# Patient Record
Sex: Female | Born: 1985 | Race: Black or African American | Hispanic: No | Marital: Married | Smoking: Never smoker
Health system: Southern US, Community
[De-identification: ages and names within clinical notes are randomized; demographics above are authoritative.]

## PROBLEM LIST (undated history)

## (undated) DIAGNOSIS — R7303 Prediabetes: Secondary | ICD-10-CM

---

## 2009-06-04 ENCOUNTER — Emergency Department (HOSPITAL_BASED_OUTPATIENT_CLINIC_OR_DEPARTMENT_OTHER): Admission: EM | Admit: 2009-06-04 | Discharge: 2009-06-04 | Payer: Self-pay | Admitting: Emergency Medicine

## 2009-06-22 ENCOUNTER — Emergency Department (HOSPITAL_BASED_OUTPATIENT_CLINIC_OR_DEPARTMENT_OTHER): Admission: EM | Admit: 2009-06-22 | Discharge: 2009-06-22 | Payer: Self-pay | Admitting: Emergency Medicine

## 2009-06-22 ENCOUNTER — Ambulatory Visit: Payer: Self-pay | Admitting: Diagnostic Radiology

## 2010-03-25 ENCOUNTER — Ambulatory Visit: Payer: Self-pay | Admitting: Interventional Radiology

## 2010-03-25 ENCOUNTER — Emergency Department (HOSPITAL_BASED_OUTPATIENT_CLINIC_OR_DEPARTMENT_OTHER): Admission: EM | Admit: 2010-03-25 | Discharge: 2010-03-25 | Payer: Self-pay | Admitting: Emergency Medicine

## 2010-10-11 IMAGING — CR DG CHEST 2V
2 series · 2 of 2 positions shown · non-contrast
Comparison: None

CLINICAL DATA: Chest pain.

CHEST - 2 VIEW

[w chest pa]
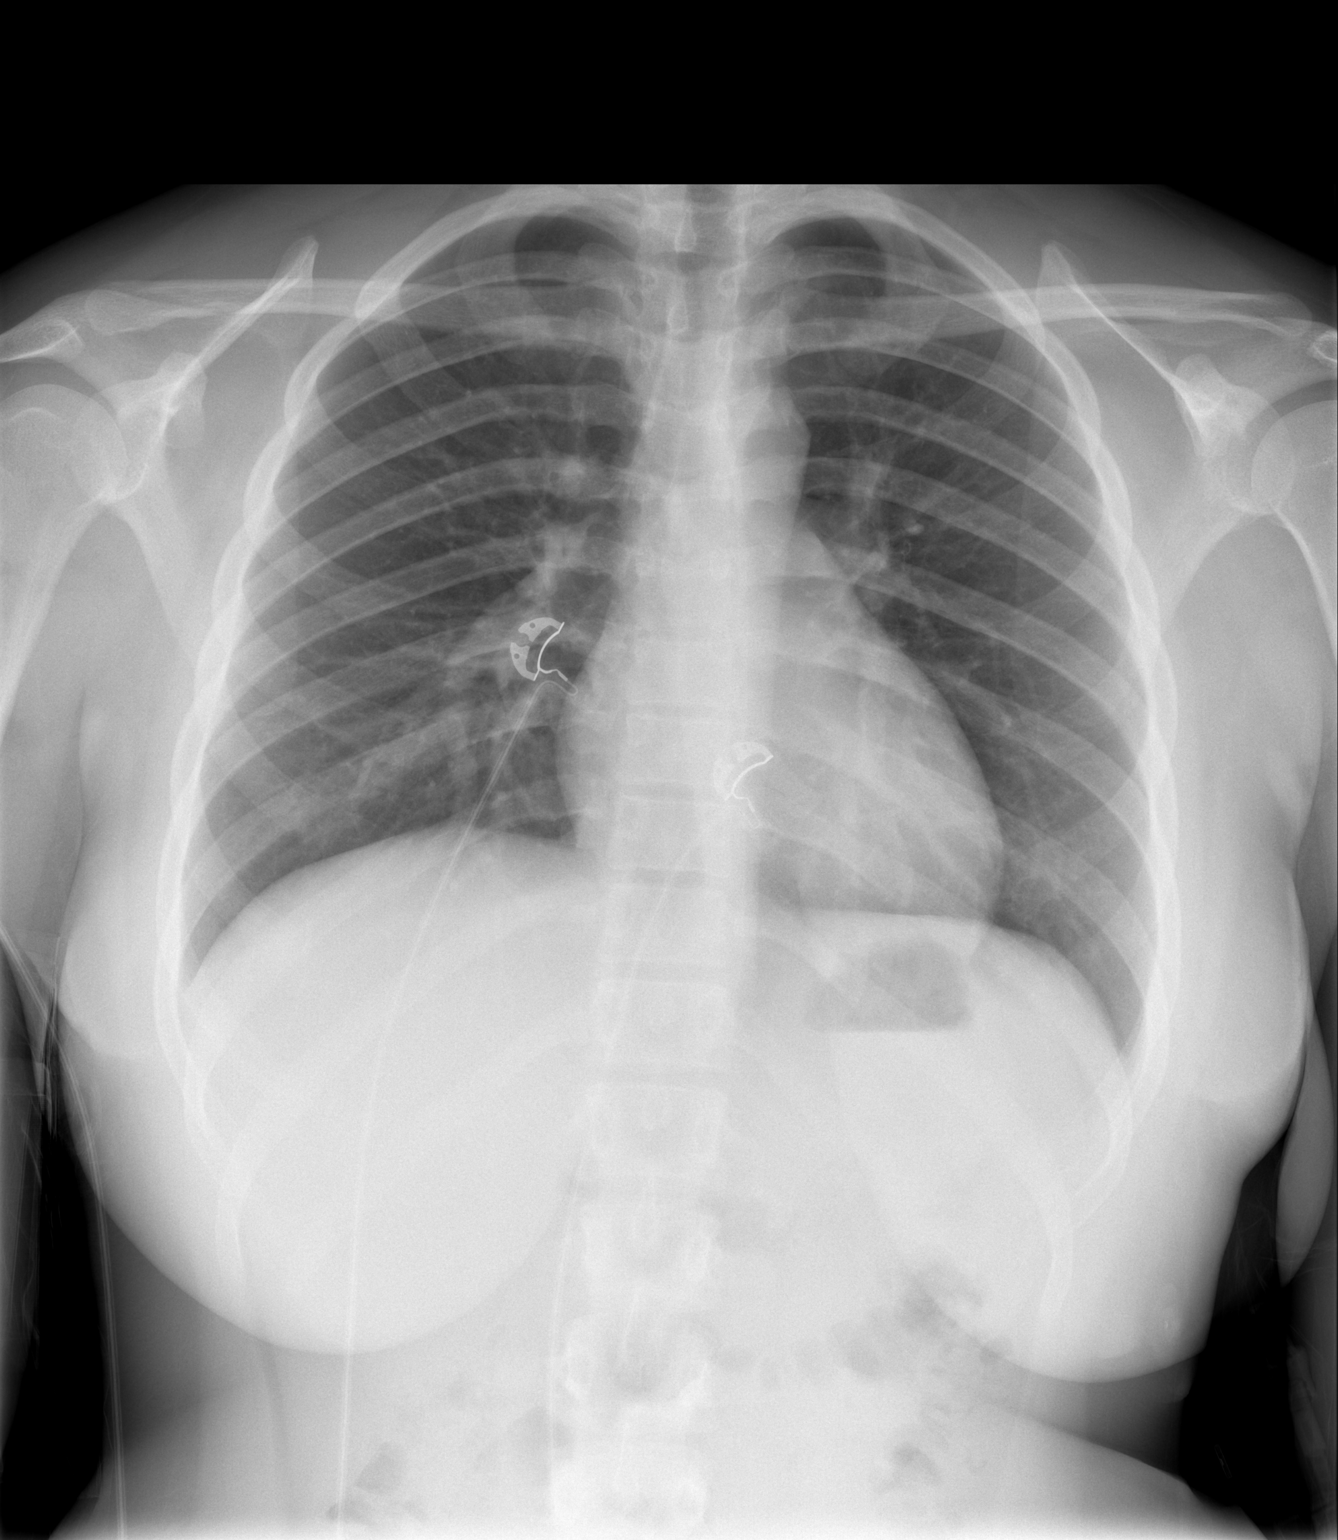

[w chest lat]
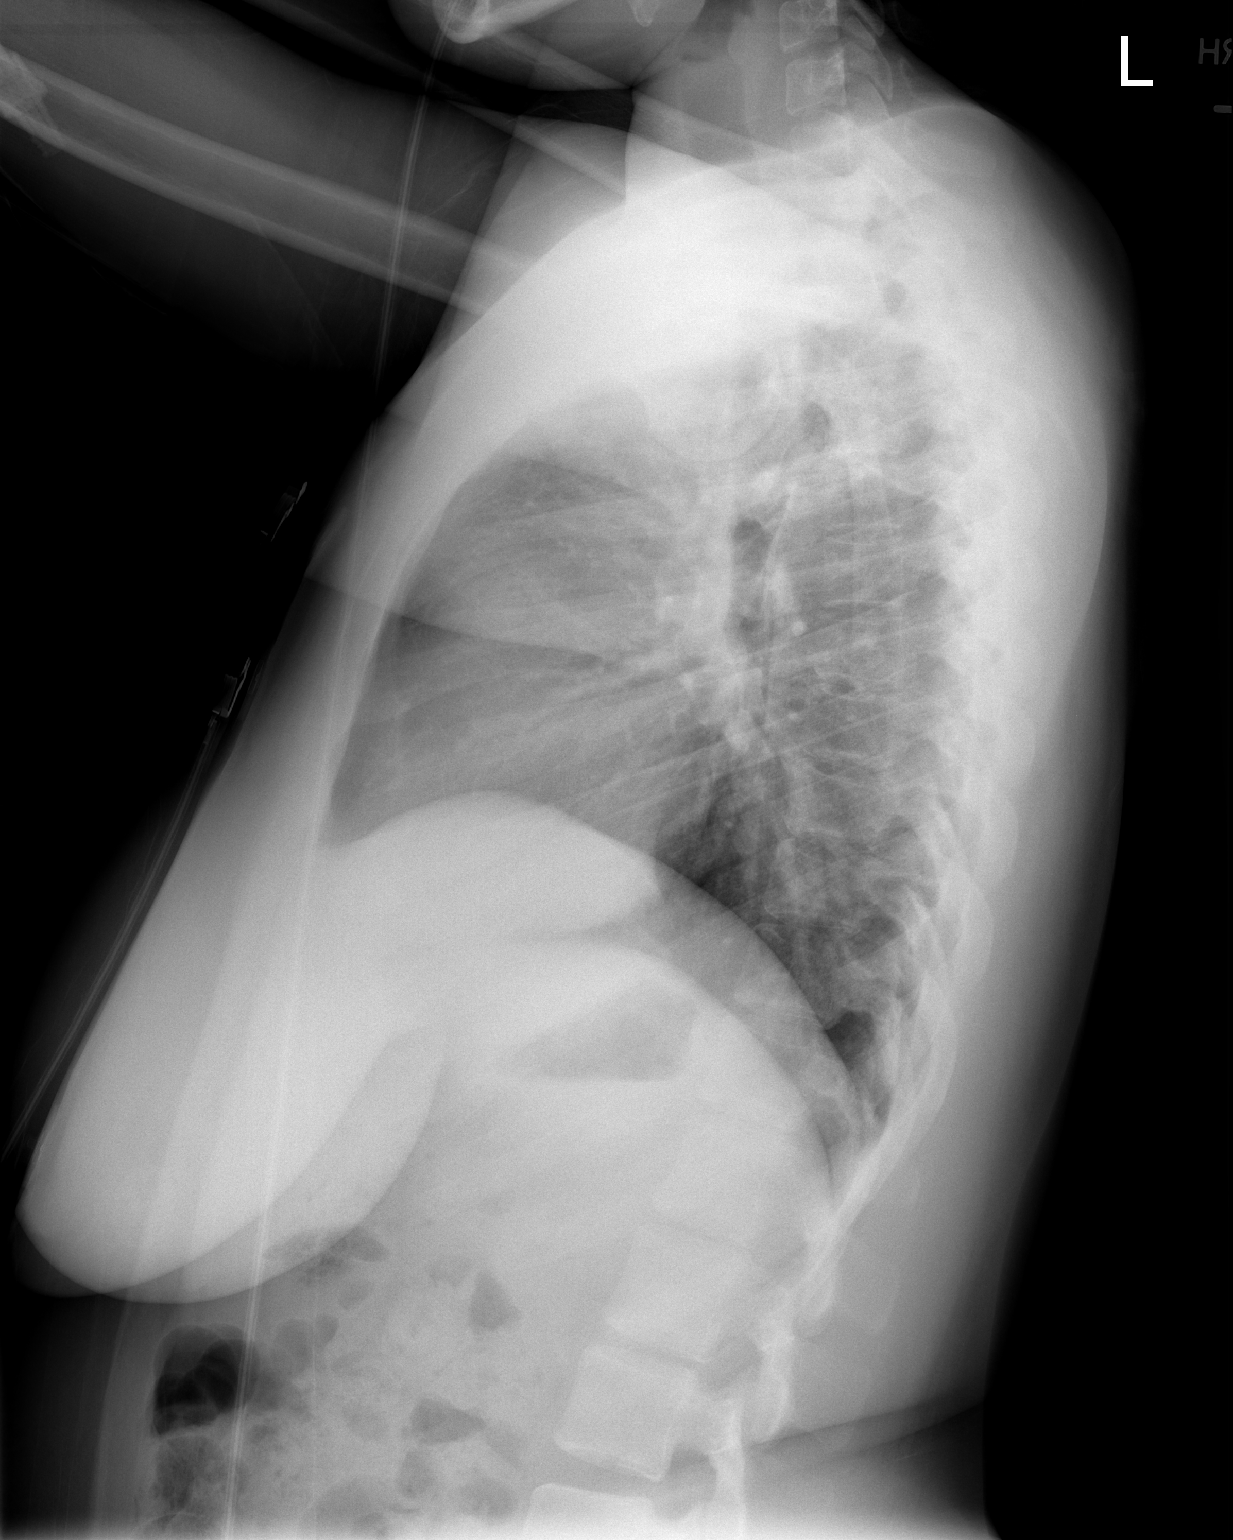

[2 of 2 positions shown; findings below may reference images not displayed]

FINDINGS: The heart size and mediastinal contours are within normal
limits.  Both lungs are clear.  The visualized skeletal structures
are unremarkable.
IMPRESSION: No active disease.

## 2011-09-19 ENCOUNTER — Encounter (HOSPITAL_BASED_OUTPATIENT_CLINIC_OR_DEPARTMENT_OTHER): Payer: Self-pay | Admitting: *Deleted

## 2011-09-19 ENCOUNTER — Emergency Department (INDEPENDENT_AMBULATORY_CARE_PROVIDER_SITE_OTHER): Payer: Self-pay

## 2011-09-19 ENCOUNTER — Other Ambulatory Visit: Payer: Self-pay

## 2011-09-19 ENCOUNTER — Emergency Department (HOSPITAL_BASED_OUTPATIENT_CLINIC_OR_DEPARTMENT_OTHER)
Admission: EM | Admit: 2011-09-19 | Discharge: 2011-09-19 | Disposition: A | Discharge status: Home / Self Care | Payer: Self-pay | Attending: Emergency Medicine | Admitting: Emergency Medicine

## 2011-09-19 DIAGNOSIS — R079 Chest pain, unspecified: Secondary | ICD-10-CM | POA: Insufficient documentation

## 2011-09-19 DIAGNOSIS — R630 Anorexia: Secondary | ICD-10-CM

## 2011-09-19 DIAGNOSIS — R5381 Other malaise: Secondary | ICD-10-CM

## 2011-09-19 DIAGNOSIS — R0602 Shortness of breath: Secondary | ICD-10-CM

## 2011-09-19 DIAGNOSIS — E669 Obesity, unspecified: Secondary | ICD-10-CM | POA: Insufficient documentation

## 2011-09-19 DIAGNOSIS — R05 Cough: Secondary | ICD-10-CM | POA: Insufficient documentation

## 2011-09-19 DIAGNOSIS — R059 Cough, unspecified: Secondary | ICD-10-CM | POA: Insufficient documentation

## 2011-09-19 DIAGNOSIS — R000 Tachycardia, unspecified: Secondary | ICD-10-CM | POA: Insufficient documentation

## 2011-09-19 MED ORDER — GUAIFENESIN-CODEINE 100-10 MG/5ML PO SYRP: 5.0000 mL | ORAL_SOLUTION | Freq: Three times a day (TID) | ORAL | Status: AC | PRN

## 2011-09-19 MED ORDER — IBUPROFEN 400 MG PO TABS
400.0000 mg | ORAL_TABLET | Freq: Once | ORAL | Status: AC
  Administered 2011-09-19: 400 mg via ORAL
  Filled 2011-09-19: qty 1

## 2011-09-19 NOTE — ED Provider Notes (Signed)
History    26 year old female with cough and chest pain. Noticed when woke up around 6:00 yesterday morning. Pain is in the substernal area, worse with coughing and relatively constant. Describes pain as achy in character. No appreciable exacerbating or relieving factors. Cough is nonproductive. Reports temperature at 100.6. Denies shortness of breath. No unusual leg pain or swelling. Denies history of blood clot. Denies exogenous estrogen use. No recent immobilization. No history of cancer or Recent surgery. Also mild headache. No other complaints. No significant past medical history. Nonsmoker. CSN: [id number]  Arrival date & time 09/19/11  4098   First MD Initiated Contact with Patient 09/19/11 1016      Chief Complaint  Patient presents with  . Chest Pain    (Consider location/radiation/quality/duration/timing/severity/associated sxs/prior treatment) HPI  History reviewed. No pertinent past medical history.  History reviewed. No pertinent past surgical history.  No family history on file.  History  Substance Use Topics  . Smoking status: Never Smoker   . Smokeless tobacco: Never Used  . Alcohol Use: No    OB History    Grav Para Term Preterm Abortions TAB SAB Ect Mult Living                  Review of Systems   Review of symptoms negative unless otherwise noted in HPI.   Allergies  Review of patient's allergies indicates no known allergies.  Home Medications  No current outpatient prescriptions on file.  BP 115/66  Pulse 101  Temp(Src) 98.4 F (36.9 C) (Oral)  Resp 18  Ht 5\' 2"  (1.575 m)  Wt 190 lb (86.183 kg)  BMI 34.75 kg/m2  SpO2 100%  Physical Exam  Nursing note and vitals reviewed. Constitutional: No distress.       Sitting up in bed. No acute distress. Obese.  HENT:  Head: Normocephalic and atraumatic.  Right Ear: External ear normal.  Left Ear: External ear normal.  Eyes: Conjunctivae are normal. Pupils are equal, round, and reactive to  light. Right eye exhibits no discharge. Left eye exhibits no discharge.  Neck: Normal range of motion. Neck supple.  Cardiovascular: Regular rhythm and normal heart sounds.  Exam reveals no gallop and no friction rub.   No murmur heard.      Mild tachycardia. No friction rub.  Pulmonary/Chest: Effort normal and breath sounds normal. No respiratory distress. She exhibits tenderness.       Lungs are clear. Symmetric chest rise equal breath sounds. Chest pain is reproducible with palpation in the mid to upper sternal area.  Abdominal: Soft. She exhibits no distension. There is no tenderness.  Musculoskeletal: She exhibits no edema and no tenderness.       Lower extremities symmetric. No edema. No calf tenderness. Negative Homans sign.  Lymphadenopathy:    She has no cervical adenopathy.  Neurological: She is alert.  Skin: Skin is warm and dry. She is not diaphoretic.  Psychiatric: She has a normal mood and affect. Her behavior is normal. Thought content normal.    ED Course  Procedures (including critical care time)  Labs Reviewed - No data to display Dg Chest 2 View  09/19/2011  *RADIOLOGY REPORT*  Clinical Data: Shortness of breath, chest pain, decreased energy, no appetite, fever, chills  CHEST - 2 VIEW  Comparison: 03/25/2010  Findings: Normal heart size, mediastinal contours, and pulmonary vascularity. Chronic peribronchial thickening. No definite infiltrate, pleural effusion, or pneumothorax. No acute osseous findings.  IMPRESSION: Minimal chronic bronchitic changes.  Original Report Authenticated  By: Lollie Marrow, M.D.   EKG:  Rhythm: Normal sinus rhythm Rate: 94 Axis: Normal Intervals: Normal ST segments: Nonspecific ST changes anteriorly and laterally.   1. Cough   2. Chest pain       MDM  26 year old female with chest pain and cough. Doubt ACS. Pain is atypical and reproducible on exam. Patient has no significant risk factors. Mild tachycardia noted, but low clinical  suspicion for pulmonary embolism. Consider myopericarditis but doubt. Chest x-ray with no focal consolidation and no pneumothorax. Suspect viral URI. Plan symptomatic treatment. Return precautions discussed. Outpatient followup as needed.       Raeford Razor, MD 09/19/11 1106

## 2011-09-19 NOTE — ED Notes (Signed)
Patient states she woke up with chest pain yesterday morning.  Describes pain as aching pressure.  Pain is associated with a cough and a headache.

## 2011-09-19 NOTE — ED Notes (Signed)
Patient states she has had a headache for several day, temperature of 100.6 yesterday and fatigue.

## 2012-01-20 ENCOUNTER — Emergency Department (HOSPITAL_BASED_OUTPATIENT_CLINIC_OR_DEPARTMENT_OTHER): Payer: Self-pay

## 2012-01-20 ENCOUNTER — Emergency Department (HOSPITAL_BASED_OUTPATIENT_CLINIC_OR_DEPARTMENT_OTHER)
Admission: EM | Admit: 2012-01-20 | Discharge: 2012-01-20 | Disposition: A | Discharge status: Home / Self Care | Payer: Self-pay | Attending: Emergency Medicine | Admitting: Emergency Medicine

## 2012-01-20 ENCOUNTER — Encounter (HOSPITAL_BASED_OUTPATIENT_CLINIC_OR_DEPARTMENT_OTHER): Payer: Self-pay | Admitting: *Deleted

## 2012-01-20 DIAGNOSIS — K2970 Gastritis, unspecified, without bleeding: Secondary | ICD-10-CM

## 2012-01-20 LAB — URINALYSIS, ROUTINE W REFLEX MICROSCOPIC
Bilirubin Urine: NEGATIVE
Glucose, UA: NEGATIVE mg/dL
Hgb urine dipstick: NEGATIVE
Ketones, ur: NEGATIVE mg/dL
Nitrite: NEGATIVE
Specific Gravity, Urine: 1.031 — ABNORMAL HIGH (ref 1.005–1.030)
Urobilinogen, UA: 1 mg/dL (ref 0.0–1.0)
pH: 5.5 (ref 5.0–8.0)

## 2012-01-20 LAB — CBC: HCT: 37.2 % (ref 36.0–46.0)

## 2012-01-20 LAB — COMPREHENSIVE METABOLIC PANEL
Alkaline Phosphatase: 52 U/L (ref 39–117)
BUN: 13 mg/dL (ref 6–23)
Calcium: 9.6 mg/dL (ref 8.4–10.5)
Chloride: 104 mEq/L (ref 96–112)
GFR calc Af Amer: >90 mL/min (ref >90)
GFR calc non Af Amer: >90 mL/min (ref >90)
Glucose, Bld: 107 mg/dL — ABNORMAL HIGH (ref 70–99)
Potassium: 3.9 mEq/L (ref 3.5–5.1)
Total Bilirubin: 0.4 mg/dL (ref 0.3–1.2)

## 2012-01-20 LAB — LIPASE, BLOOD: Lipase: 27 U/L (ref 11–59)

## 2012-01-20 LAB — URINE MICROSCOPIC-ADD ON

## 2012-01-20 LAB — PREGNANCY, URINE: Preg Test, Ur: NEGATIVE

## 2012-01-20 MED ORDER — GI COCKTAIL ~~LOC~~
30.0000 mL | Freq: Once | ORAL | Status: AC
  Administered 2012-01-20: 30 mL via ORAL
  Filled 2012-01-20: qty 30

## 2012-01-20 MED ORDER — OMEPRAZOLE 20 MG PO CPDR: 20.0000 mg | DELAYED_RELEASE_CAPSULE | Freq: Every day | ORAL | Status: DC

## 2012-01-20 NOTE — ED Provider Notes (Signed)
History     CSN: [id number]  Arrival date & time 01/20/12  1511   First MD Initiated Contact with Patient 01/20/12 1537      Chief Complaint  Patient presents with  . Abdominal Pain    (Consider location/radiation/quality/duration/timing/severity/associated sxs/prior treatment) HPI Comments: Pt c/o abdominal pain to left side that started at 11am today.  Says that pain is sharp, waxes and wanes in intensity.  Starts in LUQ, radiates across upper abdomen and to back.  No pain to lower abd.  No f/c.  No n/v/d.  No vag bleeding or discharge.  No hx of pain like this in past.  No chest pain or SOB.  Pain is not pleuritic.  Ate oatmeal and biscuit this am for breakfast.  Patient is a 26 y.o. female presenting with abdominal pain. The history is provided by the patient.  Abdominal Pain The primary symptoms of the illness include abdominal pain. The primary symptoms of the illness do not include fever, fatigue, shortness of breath, nausea, vomiting or diarrhea.  Symptoms associated with the illness do not include chills, diaphoresis, hematuria, frequency or back pain.    History reviewed. No pertinent past medical history.  History reviewed. No pertinent past surgical history.  No family history on file.  History  Substance Use Topics  . Smoking status: Never Smoker   . Smokeless tobacco: Never Used  . Alcohol Use: No    OB History    Grav Para Term Preterm Abortions TAB SAB Ect Mult Living                  Review of Systems  Constitutional: Negative for fever, chills, diaphoresis and fatigue.  HENT: Negative for congestion, rhinorrhea and sneezing.   Eyes: Negative.   Respiratory: Negative for cough, chest tightness and shortness of breath.   Cardiovascular: Negative for chest pain and leg swelling.  Gastrointestinal: Positive for abdominal pain. Negative for nausea, vomiting, diarrhea and blood in stool.  Genitourinary: Negative for frequency, hematuria, flank pain and  difficulty urinating.  Musculoskeletal: Negative for back pain and arthralgias.  Skin: Negative for rash.  Neurological: Negative for dizziness, speech difficulty, weakness, numbness and headaches.    Allergies  Review of patient's allergies indicates no known allergies.  Home Medications   Current Outpatient Rx  Name Route Sig Dispense Refill  . LEVONORGESTREL 20 MCG/24HR IU IUD Intrauterine 1 each by Intrauterine route once. Inserted in 2010    . OMEPRAZOLE 20 MG PO CPDR Oral Take 1 capsule (20 mg total) by mouth daily. 30 capsule 0    BP 98/60  Pulse 77  Temp(Src) 98.4 F (36.9 C) (Oral)  Resp 20  SpO2 100%  LMP 01/06/2012  Physical Exam  Constitutional: She is oriented to person, place, and time. She appears well-developed and well-nourished.  HENT:  Head: Normocephalic and atraumatic.  Eyes: Pupils are equal, round, and reactive to light.  Neck: Normal range of motion. Neck supple.  Cardiovascular: Normal rate, regular rhythm and normal heart sounds.   Pulmonary/Chest: Effort normal and breath sounds normal. No respiratory distress. She has no wheezes. She has no rales. She exhibits no tenderness.  Abdominal: Soft. Bowel sounds are normal. There is tenderness. There is no rebound and no guarding.       Moderated TTP LUQ and left mid abdomen.  Mild TTP epigastric area.  No pain to lower abdomen.  Musculoskeletal: Normal range of motion. She exhibits no edema.  Lymphadenopathy:    She has no  cervical adenopathy.  Neurological: She is alert and oriented to person, place, and time.  Skin: Skin is warm and dry. No rash noted.  Psychiatric: She has a normal mood and affect.    ED Course  Procedures (including critical care time)  Results for orders placed during the hospital encounter of 01/20/12  URINALYSIS, ROUTINE W REFLEX MICROSCOPIC      Component Value Range   Color, Urine YELLOW  YELLOW    APPearance CLOUDY (*) CLEAR    Specific Gravity, Urine 1.031 (*) 1.005  - 1.030    pH 5.5  5.0 - 8.0    Glucose, UA NEGATIVE  NEGATIVE (mg/dL)   Hgb urine dipstick NEGATIVE  NEGATIVE    Bilirubin Urine NEGATIVE  NEGATIVE    Ketones, ur NEGATIVE  NEGATIVE (mg/dL)   Protein, ur NEGATIVE  NEGATIVE (mg/dL)   Urobilinogen, UA 1.0  0.0 - 1.0 (mg/dL)   Nitrite NEGATIVE  NEGATIVE    Leukocytes, UA SMALL (*) NEGATIVE   PREGNANCY, URINE      Component Value Range   Preg Test, Ur NEGATIVE  NEGATIVE   URINE MICROSCOPIC-ADD ON      Component Value Range   Squamous Epithelial / LPF FEW (*) RARE    WBC, UA 3-6  <3 (WBC/hpf)   Bacteria, UA RARE  RARE   CBC      Component Value Range   WBC 5.4  4.0 - 10.5 (K/uL)   RBC 4.03  3.87 - 5.11 (MIL/uL)   Hemoglobin 12.7  12.0 - 15.0 (g/dL)   HCT 78.2  95.6 - 21.3 (%)   MCV 92.3  78.0 - 100.0 (fL)   MCH 31.5  26.0 - 34.0 (pg)   MCHC 34.1  30.0 - 36.0 (g/dL)   RDW 08.6  57.8 - 46.9 (%)   Platelets 246  150 - 400 (K/uL)  DIFFERENTIAL      Component Value Range   Neutrophils Relative 48  43 - 77 (%)   Neutro Abs 2.6  1.7 - 7.7 (K/uL)   Lymphocytes Relative 44  12 - 46 (%)   Lymphs Abs 2.3  0.7 - 4.0 (K/uL)   Monocytes Relative 7  3 - 12 (%)   Monocytes Absolute 0.4  0.1 - 1.0 (K/uL)   Eosinophils Relative 2  0 - 5 (%)   Eosinophils Absolute 0.1  0.0 - 0.7 (K/uL)   Basophils Relative 0  0 - 1 (%)   Basophils Absolute 0.0  0.0 - 0.1 (K/uL)  COMPREHENSIVE METABOLIC PANEL      Component Value Range   Sodium 137  135 - 145 (mEq/L)   Potassium 3.9  3.5 - 5.1 (mEq/L)   Chloride 104  96 - 112 (mEq/L)   CO2 28  19 - 32 (mEq/L)   Glucose, Bld 107 (*) 70 - 99 (mg/dL)   BUN 13  6 - 23 (mg/dL)   Creatinine, Ser 6.29  0.50 - 1.10 (mg/dL)   Calcium 9.6  8.4 - 52.8 (mg/dL)   Total Protein 6.9  6.0 - 8.3 (g/dL)   Albumin 3.5  3.5 - 5.2 (g/dL)   AST 14  0 - 37 (U/L)   ALT 9  0 - 35 (U/L)   Alkaline Phosphatase 52  39 - 117 (U/L)   Total Bilirubin 0.4  0.3 - 1.2 (mg/dL)   GFR calc non Af Amer >90  >90 (mL/min)   GFR calc Af  Amer >90  >90 (mL/min)  LIPASE, BLOOD  Component Value Range   Lipase 27  11 - 59 (U/L)   Dg Abd Acute W/chest  01/20/2012  *RADIOLOGY REPORT*  Clinical Data: Left-sided pain under rib cage.  ACUTE ABDOMEN SERIES (ABDOMEN 2 VIEW & CHEST 1 VIEW)  Comparison:  None.  Findings:  There is no evidence of dilated bowel loops or free intraperitoneal air.  No radiopaque calculi or other significant radiographic abnormality is seen. Heart size and mediastinal contours are within normal limits.  Both lungs are clear.IUD appears appropriately located within the pelvis.  IMPRESSION: Negative abdominal radiographs.  No acute cardiopulmonary disease.  Original Report Authenticated By: Elsie Stain, M.D.      1. Gastritis       MDM  Pts pain is completely gone after GI cocktail.  No pain on exam.  Labs okay, no evidence of pancreaitis.  No pain over gallbladder.  Will d/c with rx for prilosec.  Advised bland diet.  F/u with her PMD or return her if symptoms not improving or for any worsening symptoms.       Rolan Bucco, MD 01/20/12 [phone]

## 2012-01-20 NOTE — Discharge Instructions (Signed)
Gastritis Gastritis is an inflammation (the body's way of reacting to injury and/or infection) of the stomach. It is often caused by viral or bacterial (germ) infections. It can also be caused by chemicals (including alcohol) and medications. This illness may be associated with generalized malaise (feeling tired, not well), cramps, and fever. The illness may last 2 to 7 days. If symptoms of gastritis continue, gastroscopy (looking into the stomach with a telescope-like instrument), biopsy (taking tissue samples), and/or blood tests may be necessary to determine the cause. Antibiotics will not affect the illness unless there is a bacterial infection present. One common bacterial cause of gastritis is an organism known as H. Pylori. This can be treated with antibiotics. Other forms of gastritis are caused by too much acid in the stomach. They can be treated with medications such as H2 blockers and antacids. Home treatment is usually all that is needed. Young children will quickly become dehydrated (loss of body fluids) if vomiting and diarrhea are both present. Medications may be given to control nausea. Medications are usually not given for diarrhea unless especially bothersome. Some medications slow the removal of the virus from the gastrointestinal tract. This slows down the healing process. HOME CARE INSTRUCTIONS Home care instructions for nausea and vomiting:  For adults: drink small amounts of fluids often. Drink at least 2 quarts a day. Take sips frequently. Do not drink large amounts of fluid at one time. This may worsen the nausea.   Only take over-the-counter or prescription medicines for pain, discomfort, or fever as directed by your caregiver.   Drink clear liquids only. Those are anything you can see through such as water, broth, or soft drinks.   Once you are keeping clear liquids down, you may start full liquids, soups, juices, and ice cream or sherbet. Slowly add bland (plain, not spicy)  foods to your diet.  Home care instructions for diarrhea:  Diarrhea can be caused by bacterial infections or a virus. Your condition should improve with time, rest, fluids, and/or anti-diarrheal medication.   Until your diarrhea is under control, you should drink clear liquids often in small amounts. Clear liquids include: water, broth, jell-o water and weak tea.  Avoid:  Milk.   Fruits.   Tobacco.   Alcohol.   Extremely hot or cold fluids.   Too much intake of anything at one time.  When your diarrhea stops you may add the following foods, which help the stool to become more formed:  Rice.   Bananas.   Apples without skin.   Dry toast.  Once these foods are tolerated you may add low-fat yogurt and low-fat cottage cheese. They will help to restore the normal bacterial balance in your bowel. Wash your hands well to avoid spreading bacteria (germ) or virus. SEEK IMMEDIATE MEDICAL CARE IF:   You are unable to keep fluids down.   Vomiting or diarrhea become persistent (constant).   Abdominal pain develops, increases, or localizes. (Right sided pain can be appendicitis. Left sided pain in adults can be diverticulitis.)   You develop a fever (an oral temperature above 102 F (38.9 C)).   Diarrhea becomes excessive or contains blood or mucus.   You have excessive weakness, dizziness, fainting or extreme thirst.   You are not improving or you are getting worse.   You have any other questions or concerns.  Document Released: 08/05/2001 Document Revised: 07/31/2011 Document Reviewed: 08/11/2005 [employer] Patient Information 2012 [employer]. 

## 2012-01-20 NOTE — ED Notes (Signed)
Left mid quadrant pain x 4 hours. Denies constipation.

## 2012-08-25 ENCOUNTER — Encounter (HOSPITAL_BASED_OUTPATIENT_CLINIC_OR_DEPARTMENT_OTHER): Payer: Self-pay | Admitting: *Deleted

## 2012-08-25 ENCOUNTER — Emergency Department (HOSPITAL_BASED_OUTPATIENT_CLINIC_OR_DEPARTMENT_OTHER)
Admission: EM | Admit: 2012-08-25 | Discharge: 2012-08-26 | Discharge status: Against Medical Advice | Payer: Self-pay | Attending: Emergency Medicine | Admitting: Emergency Medicine

## 2012-08-25 DIAGNOSIS — R079 Chest pain, unspecified: Secondary | ICD-10-CM | POA: Insufficient documentation

## 2012-08-25 DIAGNOSIS — Z3202 Encounter for pregnancy test, result negative: Secondary | ICD-10-CM | POA: Insufficient documentation

## 2012-08-25 DIAGNOSIS — R0602 Shortness of breath: Secondary | ICD-10-CM | POA: Insufficient documentation

## 2012-08-25 LAB — PREGNANCY, URINE: Preg Test, Ur: NEGATIVE

## 2012-08-25 NOTE — ED Notes (Addendum)
SHOB, CP onset this a.m. Annabelle Harman, RRT to triage to evaluate. Denies other s/s. No distress.

## 2012-08-25 NOTE — ED Notes (Signed)
Patient states that she has been having chest pain on and off and when the chest pain comes she feels short of breath. Patients breath sounds are diminished and clear. Patient states that she does not have a history of asthma. Patient does not currently appear to be in any acute respiratory distress. Her Spo2 is maintaining well on room air. RT will continue to monitor.

## 2012-08-26 ENCOUNTER — Emergency Department (HOSPITAL_BASED_OUTPATIENT_CLINIC_OR_DEPARTMENT_OTHER): Payer: Self-pay

## 2012-08-26 ENCOUNTER — Emergency Department (HOSPITAL_BASED_OUTPATIENT_CLINIC_OR_DEPARTMENT_OTHER)
Admission: EM | Admit: 2012-08-26 | Discharge: 2012-08-26 | Disposition: A | Discharge status: Home / Self Care | Payer: Self-pay | Attending: Emergency Medicine | Admitting: Emergency Medicine

## 2012-08-26 ENCOUNTER — Encounter (HOSPITAL_BASED_OUTPATIENT_CLINIC_OR_DEPARTMENT_OTHER): Payer: Self-pay

## 2012-08-26 DIAGNOSIS — R071 Chest pain on breathing: Secondary | ICD-10-CM | POA: Insufficient documentation

## 2012-08-26 LAB — D-DIMER, QUANTITATIVE: D-Dimer, Quant: 0.44 ug/mL-FEU (ref 0.00–0.48)

## 2012-08-26 MED ORDER — HYDROCODONE-ACETAMINOPHEN 5-325 MG PO TABS: 2.0000 | ORAL_TABLET | ORAL | Status: DC | PRN

## 2012-08-26 MED ORDER — IBUPROFEN 800 MG PO TABS: 800.0000 mg | ORAL_TABLET | Freq: Three times a day (TID) | ORAL | Status: DC

## 2012-08-26 NOTE — ED Notes (Signed)
Patient back from  X-ray 

## 2012-08-26 NOTE — ED Notes (Signed)
PA at bedside.

## 2012-08-26 NOTE — ED Provider Notes (Signed)
History     CSN: [id number]  Arrival date & time 08/26/12  1429   First MD Initiated Contact with Patient 08/26/12 1722      Chief Complaint  Patient presents with  . Shortness of Breath  . Chest Pain    (Consider location/radiation/quality/duration/timing/severity/associated sxs/prior treatment) Patient is a 27 y.o. female presenting with chest pain. The history is provided by the patient. No language interpreter was used.  Chest Pain The chest pain began 2 days ago. Duration of episode(s) is 2 days. Chest pain occurs constantly. The chest pain is unchanged. The pain is associated with exertion. The quality of the pain is described as aching. The pain does not radiate. Chest pain is worsened by deep breathing. She tried nothing for the symptoms. There are no known risk factors.  Pertinent negatives for past medical history include no DVT.  Pertinent negatives for family medical history include: no heart disease in family.     History reviewed. No pertinent past medical history.  History reviewed. No pertinent past surgical history.  No family history on file.  History  Substance Use Topics  . Smoking status: Never Smoker   . Smokeless tobacco: Never Used  . Alcohol Use: No    OB History    Grav Para Term Preterm Abortions TAB SAB Ect Mult Living                  Review of Systems  Cardiovascular: Positive for chest pain.  All other systems reviewed and are negative.    Allergies  Review of patient's allergies indicates no known allergies.  Home Medications   Current Outpatient Rx  Name  Route  Sig  Dispense  Refill  . LEVONORGESTREL 20 MCG/24HR IU IUD   Intrauterine   1 each by Intrauterine route once. Inserted in 2010         . OMEPRAZOLE 20 MG PO CPDR   Oral   Take 1 capsule (20 mg total) by mouth daily.   30 capsule   0     BP 104/64  Pulse 68  Temp 97.9 F (36.6 C) (Oral)  Resp 20  Ht 5\' 2"  (1.575 m)  Wt 210 lb (95.255 kg)  BMI 38.41  kg/m2  SpO2 100%  LMP 08/04/2012  Physical Exam  Constitutional: She is oriented to person, place, and time. She appears well-developed and well-nourished.  HENT:  Head: Normocephalic and atraumatic.  Mouth/Throat: Oropharynx is clear and moist.  Eyes: EOM are normal. Pupils are equal, round, and reactive to light.  Neck: Normal range of motion.  Cardiovascular: Normal rate and regular rhythm.   Pulmonary/Chest: Effort normal and breath sounds normal.  Abdominal: Soft. She exhibits no distension.  Musculoskeletal: Normal range of motion.  Neurological: She is alert and oriented to person, place, and time.  Skin: Skin is warm.  Psychiatric: She has a normal mood and affect.    ED Course  Procedures (including critical care time)   Labs Reviewed  D-DIMER, QUANTITATIVE   No results found.   1. Costochondral chest pain       MDM  Vicodin  And ibuprofen.   Pt advised to follow up with her MD for recheck.   I doubt cardiac etiology,         Elson Areas, [place] 08/26/12 1946

## 2012-08-26 NOTE — ED Provider Notes (Signed)
Medical screening examination/treatment/procedure(s) were performed by non-physician practitioner and as supervising physician I was immediately available for consultation/collaboration.  Maicee Ullman, MD 08/26/12 2103 

## 2012-08-26 NOTE — ED Notes (Signed)
Patient transported to X-Ray via stretcher.  

## 2012-08-26 NOTE — ED Notes (Signed)
Pt reports a 2 day history of intermittent chest pain associated with SHOB.

## 2013-09-29 ENCOUNTER — Encounter (HOSPITAL_BASED_OUTPATIENT_CLINIC_OR_DEPARTMENT_OTHER): Payer: Self-pay | Admitting: Emergency Medicine

## 2013-09-29 ENCOUNTER — Emergency Department (HOSPITAL_BASED_OUTPATIENT_CLINIC_OR_DEPARTMENT_OTHER): Payer: Self-pay

## 2013-09-29 ENCOUNTER — Emergency Department (HOSPITAL_BASED_OUTPATIENT_CLINIC_OR_DEPARTMENT_OTHER)
Admission: EM | Admit: 2013-09-29 | Discharge: 2013-09-29 | Disposition: A | Discharge status: Home / Self Care | Payer: Self-pay | Attending: Emergency Medicine | Admitting: Emergency Medicine

## 2013-09-29 DIAGNOSIS — R51 Headache: Secondary | ICD-10-CM | POA: Insufficient documentation

## 2013-09-29 DIAGNOSIS — J159 Unspecified bacterial pneumonia: Secondary | ICD-10-CM | POA: Insufficient documentation

## 2013-09-29 DIAGNOSIS — Z79899 Other long term (current) drug therapy: Secondary | ICD-10-CM | POA: Insufficient documentation

## 2013-09-29 DIAGNOSIS — IMO0001 Reserved for inherently not codable concepts without codable children: Secondary | ICD-10-CM | POA: Insufficient documentation

## 2013-09-29 DIAGNOSIS — J189 Pneumonia, unspecified organism: Secondary | ICD-10-CM

## 2013-09-29 DIAGNOSIS — R42 Dizziness and giddiness: Secondary | ICD-10-CM | POA: Insufficient documentation

## 2013-09-29 LAB — RAPID STREP SCREEN (MED CTR MEBANE ONLY): Streptococcus, Group A Screen (Direct): NEGATIVE

## 2013-09-29 MED ORDER — IPRATROPIUM-ALBUTEROL 0.5-2.5 (3) MG/3ML IN SOLN
3.0000 mL | RESPIRATORY_TRACT | Status: DC
  Administered 2013-09-29: 3 mL via RESPIRATORY_TRACT
  Filled 2013-09-29: qty 3

## 2013-09-29 MED ORDER — AZITHROMYCIN 250 MG PO TABS: ORAL_TABLET | ORAL | Status: DC

## 2013-09-29 MED ORDER — HYDROCODONE-ACETAMINOPHEN 5-325 MG PO TABS: ORAL_TABLET | ORAL | Status: DC

## 2013-09-29 MED ORDER — ALBUTEROL SULFATE HFA 108 (90 BASE) MCG/ACT IN AERS
2.0000 | INHALATION_SPRAY | RESPIRATORY_TRACT | Status: DC | PRN
  Administered 2013-09-29: 2 via RESPIRATORY_TRACT
  Filled 2013-09-29: qty 6.7

## 2013-09-29 MED ORDER — GUAIFENESIN 100 MG/5ML PO SYRP: 100.0000 mg | ORAL_SOLUTION | ORAL | Status: DC | PRN

## 2013-09-29 NOTE — ED Provider Notes (Signed)
CSN: [id number]     Arrival date & time 09/29/13  1505 History   First MD Initiated Contact with Patient 09/29/13 1511     Chief Complaint  Patient presents with  . flu like symptoms    (Consider location/radiation/quality/duration/timing/severity/associated sxs/prior Treatment) Patient is a 28 y.o. female presenting with URI. The history is provided by the patient.  URI Presenting symptoms: congestion, cough, fever, rhinorrhea and sore throat   Presenting symptoms: no ear pain   Severity:  Moderate Onset quality:  Gradual Duration:  3 days Timing:  Constant Chronicity:  New Relieved by:  Nothing Worsened by:  Nothing tried Ineffective treatments:  OTC medications Associated symptoms: headaches, myalgias, sinus pain, sneezing and wheezing   Risk factors: no chronic cardiac disease, no chronic kidney disease, no chronic respiratory disease, no diabetes mellitus, no recent travel and no sick contacts    Cynthia Petty is a a 28 y.o. female who presents to the ED with cough, congestion, fever and chills that started 3 days ago.   History reviewed. No pertinent past medical history. History reviewed. No pertinent past surgical history. No family history on file. History  Substance Use Topics  . Smoking status: Never Smoker   . Smokeless tobacco: Never Used  . Alcohol Use: No   OB History   Grav Para Term Preterm Abortions TAB SAB Ect Mult Living                 Review of Systems  Constitutional: Positive for fever and chills.  HENT: Positive for congestion, rhinorrhea, sneezing and sore throat. Negative for ear pain.   Eyes: Negative for pain, redness and itching.  Respiratory: Positive for cough, shortness of breath and wheezing. Negative for chest tightness.   Cardiovascular: Negative for chest pain.  Gastrointestinal: Negative for nausea, abdominal pain and diarrhea. Vomiting: only with cough.  Genitourinary: Negative for dysuria, urgency, frequency and decreased urine  volume.  Musculoskeletal: Positive for myalgias.  Skin: Negative for rash.  Neurological: Positive for light-headedness and headaches. Negative for syncope.  Psychiatric/Behavioral: Negative for confusion. The patient is not nervous/anxious.     Allergies  Review of patient's allergies indicates no known allergies.  Home Medications   Current Outpatient Rx  Name  Route  Sig  Dispense  Refill  . HYDROcodone-acetaminophen (NORCO/VICODIN) 5-325 MG per tablet   Oral   Take 2 tablets by mouth every 4 (four) hours as needed for pain.   10 tablet   0   . ibuprofen (ADVIL,MOTRIN) 800 MG tablet   Oral   Take 1 tablet (800 mg total) by mouth 3 (three) times daily.   21 tablet   0   . levonorgestrel (MIRENA) 20 MCG/24HR IUD   Intrauterine   1 each by Intrauterine route once. Inserted in 2010         . EXPIRED: omeprazole (PRILOSEC) 20 MG capsule   Oral   Take 1 capsule (20 mg total) by mouth daily.   30 capsule   0    BP 119/61  Pulse 81  Temp(Src) 98.9 F (37.2 C) (Oral)  Resp 16  Ht 5\' 1"  (1.549 m)  Wt 230 lb (104.327 kg)  BMI 43.48 kg/m2  SpO2 100% Physical Exam  Nursing note and vitals reviewed. Constitutional: She is oriented to person, place, and time. She appears well-developed and well-nourished. No distress.  HENT:  Head: Normocephalic and atraumatic.  Right Ear: Tympanic membrane normal.  Left Ear: Tympanic membrane normal.  Nose: Mucosal edema and  rhinorrhea present.  Mouth/Throat: Uvula is midline and mucous membranes are normal. Posterior oropharyngeal erythema present.  Tonsils enlarged with exudate   Eyes: EOM are normal.  Neck: Normal range of motion. Neck supple.  Cardiovascular: Normal rate and regular rhythm.   Pulmonary/Chest: Effort normal. She has wheezes. She has rales in the right lower field.  Abdominal: Soft. There is no tenderness.  Musculoskeletal: Normal range of motion.  Lymphadenopathy:    She has cervical adenopathy.   Neurological: She is alert and oriented to person, place, and time. No cranial nerve deficit.  Skin: Skin is warm and dry.  Psychiatric: She has a normal mood and affect. Her behavior is normal.    ED Course  Procedures  Dg Chest 2 View  09/29/2013   CLINICAL DATA:  Three day history of cough and wheezing  EXAM: CHEST  2 VIEW  COMPARISON:  Prior chest x-ray 08/26/2012  FINDINGS: Faint right lower lobe patchy airspace opacity. Additionally, there is some linear atelectasis, likely in the right middle lobe. Cardiac and mediastinal contours are within normal limits. No acute fracture or lytic or blastic osseous lesions. The visualized upper abdominal bowel gas pattern is unremarkable.  IMPRESSION: Subtle patchy airspace opacity in the right lower lobe could represent early bronchopneumonia in the appropriate clinical setting.   Electronically Signed   By: Malachy Moan M.D.   On: 09/29/2013 16:21    MDM  28 y.o. female with productive cough, fever and chills x 3 days. Will treat for RLL pneumonia. Stable for discharge with Albuterol inhaler and antibiotics. VS normal and O2 SAT 100% on R/A. She will follow up with her PCP or return here for worsening symptoms.  I have reviewed this patient's vital signs, nurses notes, appropriate labs and imaging.  I have discussed findings and plan of care with the patient. She voices understanding.  Albuterol inhaler with instructions by respiratory given to patient prior to discharge.    Medication List    TAKE these medications       azithromycin 250 MG tablet  Commonly known as:  ZITHROMAX Z-PAK  Take 2 tablets PO today and then one daily for infection     guaifenesin 100 MG/5ML syrup  Commonly known as:  ROBITUSSIN  Take 5-10 mLs (100-200 mg total) by mouth every 4 (four) hours as needed for cough.      ASK your doctor about these medications       HYDROcodone-acetaminophen 5-325 MG per tablet  Commonly known as:  NORCO/VICODIN  Take 2 tablets  by mouth every 4 (four) hours as needed for pain.  Ask about: Which instructions should I use?     HYDROcodone-acetaminophen 5-325 MG per tablet  Commonly known as:  NORCO/VICODIN  Take one tablet PO every 4 hours as needed for cough  Ask about: Which instructions should I use?     ibuprofen 800 MG tablet  Commonly known as:  ADVIL,MOTRIN  Take 1 tablet (800 mg total) by mouth 3 (three) times daily.     levonorgestrel 20 MCG/24HR IUD  Commonly known as:  MIRENA  1 each by Intrauterine route once. Inserted in 2010     omeprazole 20 MG capsule  Commonly known as:  PRILOSEC  Take 1 capsule (20 mg total) by mouth daily.           [place], [place] 09/29/13 [phone]

## 2013-09-29 NOTE — ED Notes (Signed)
Body aches, productive cough, sore throat, HA, nausea x3 days.

## 2013-09-29 NOTE — ED Provider Notes (Signed)
Medical screening examination/treatment/procedure(s) were performed by non-physician practitioner and as supervising physician I was immediately available for consultation/collaboration.  EKG Interpretation   None         Jemila Camille B. Chanteria Haggard, MD 09/29/13 2319 

## 2013-10-01 LAB — CULTURE, GROUP A STREP

## 2013-10-03 ENCOUNTER — Emergency Department (HOSPITAL_BASED_OUTPATIENT_CLINIC_OR_DEPARTMENT_OTHER): Admission: EM | Admit: 2013-10-03 | Discharge: 2013-10-03 | Discharge status: Against Medical Advice | Payer: Self-pay

## 2014-02-03 ENCOUNTER — Encounter (HOSPITAL_BASED_OUTPATIENT_CLINIC_OR_DEPARTMENT_OTHER): Payer: Self-pay | Admitting: Emergency Medicine

## 2014-02-03 ENCOUNTER — Emergency Department (HOSPITAL_BASED_OUTPATIENT_CLINIC_OR_DEPARTMENT_OTHER): Payer: Self-pay

## 2014-02-03 ENCOUNTER — Emergency Department (HOSPITAL_BASED_OUTPATIENT_CLINIC_OR_DEPARTMENT_OTHER)
Admission: EM | Admit: 2014-02-03 | Discharge: 2014-02-03 | Disposition: A | Discharge status: Home / Self Care | Payer: Self-pay | Attending: Emergency Medicine | Admitting: Emergency Medicine

## 2014-02-03 DIAGNOSIS — S199XXA Unspecified injury of neck, initial encounter: Secondary | ICD-10-CM

## 2014-02-03 DIAGNOSIS — S0993XA Unspecified injury of face, initial encounter: Secondary | ICD-10-CM | POA: Insufficient documentation

## 2014-02-03 DIAGNOSIS — IMO0002 Reserved for concepts with insufficient information to code with codable children: Secondary | ICD-10-CM | POA: Insufficient documentation

## 2014-02-03 DIAGNOSIS — M549 Dorsalgia, unspecified: Secondary | ICD-10-CM

## 2014-02-03 DIAGNOSIS — Z791 Long term (current) use of non-steroidal anti-inflammatories (NSAID): Secondary | ICD-10-CM | POA: Insufficient documentation

## 2014-02-03 DIAGNOSIS — M62830 Muscle spasm of back: Secondary | ICD-10-CM

## 2014-02-03 DIAGNOSIS — M5380 Other specified dorsopathies, site unspecified: Secondary | ICD-10-CM | POA: Insufficient documentation

## 2014-02-03 DIAGNOSIS — Z792 Long term (current) use of antibiotics: Secondary | ICD-10-CM | POA: Insufficient documentation

## 2014-02-03 DIAGNOSIS — Y9389 Activity, other specified: Secondary | ICD-10-CM | POA: Insufficient documentation

## 2014-02-03 DIAGNOSIS — Y92410 Unspecified street and highway as the place of occurrence of the external cause: Secondary | ICD-10-CM | POA: Insufficient documentation

## 2014-02-03 DIAGNOSIS — E669 Obesity, unspecified: Secondary | ICD-10-CM | POA: Insufficient documentation

## 2014-02-03 DIAGNOSIS — Z79899 Other long term (current) drug therapy: Secondary | ICD-10-CM | POA: Insufficient documentation

## 2014-02-03 MED ORDER — CYCLOBENZAPRINE HCL 10 MG PO TABS: 10.0000 mg | ORAL_TABLET | Freq: Two times a day (BID) | ORAL | Status: DC | PRN

## 2014-02-03 MED ORDER — KETOROLAC TROMETHAMINE 30 MG/ML IJ SOLN
60.0000 mg | Freq: Once | INTRAMUSCULAR | Status: AC
  Administered 2014-02-03: 60 mg via INTRAMUSCULAR
  Filled 2014-02-03: qty 2

## 2014-02-03 MED ORDER — TRAMADOL HCL 50 MG PO TABS: 50.0000 mg | ORAL_TABLET | Freq: Four times a day (QID) | ORAL | Status: DC | PRN
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
## 2014-02-03 NOTE — Discharge Instructions (Signed)
Please return if you experience loss of your bladder or bowel or experience tingling or numbness of the genital area!!!!   Muscle Cramps and Spasms Muscle cramps and spasms occur when a muscle or muscles tighten and you have no control over this tightening (involuntary muscle contraction). They are a common problem and can develop in any muscle. The most common place is in the calf muscles of the leg. Both muscle cramps and muscle spasms are involuntary muscle contractions, but they also have differences:   Muscle cramps are sporadic and painful. They may last a few seconds to a quarter of an hour. Muscle cramps are often more forceful and last longer than muscle spasms.  Muscle spasms may or may not be painful. They may also last just a few seconds or much longer. CAUSES  It is uncommon for cramps or spasms to be due to a serious underlying problem. In many cases, the cause of cramps or spasms is unknown. Some common causes are:   Overexertion.   Overuse from repetitive motions (doing the same thing over and over).   Remaining in a certain position for a long period of time.   Improper preparation, form, or technique while performing a sport or activity.   Dehydration.   Injury.   Side effects of some medicines.   Abnormally low levels of the salts and ions in your blood (electrolytes), especially potassium and calcium. This could happen if you are taking water pills (diuretics) or you are pregnant.  Some underlying medical problems can make it more likely to develop cramps or spasms. These include, but are not limited to:   Diabetes.   Parkinson disease.   Hormone disorders, such as thyroid problems.   Alcohol abuse.   Diseases specific to muscles, joints, and bones.   Blood vessel disease where not enough blood is getting to the muscles.  HOME CARE INSTRUCTIONS   Stay well hydrated. Drink enough water and fluids to keep your urine clear or pale yellow.  It  may be helpful to massage, stretch, and relax the affected muscle.  For tight or tense muscles, use a warm towel, heating pad, or hot shower water directed to the affected area.  If you are sore or have pain after a cramp or spasm, applying ice to the affected area may relieve discomfort.  Put ice in a plastic bag.  Place a towel between your skin and the bag.  Leave the ice on for 15-20 minutes, 03-04 times a day.  Medicines used to treat a known cause of cramps or spasms may help reduce their frequency or severity. Only take over-the-counter or prescription medicines as directed by your caregiver. SEEK MEDICAL CARE IF:  Your cramps or spasms get more severe, more frequent, or do not improve over time.  MAKE SURE YOU:   Understand these instructions.  Will watch your condition.  Will get help right away if you are not doing well or get worse. Document Released: 01/31/2002 Document Revised: 12/06/2012 Document Reviewed: 07/28/2012 [facility] Patient Information 2014 [place], [place].
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
## 2014-02-04 NOTE — ED Provider Notes (Signed)
Medical screening examination/treatment/procedure(s) were performed by non-physician practitioner and as supervising physician I was immediately available for consultation/collaboration.   EKG Interpretation None        Addie Alonge, MD 02/04/14 1642 

## 2014-11-05 ENCOUNTER — Encounter (HOSPITAL_BASED_OUTPATIENT_CLINIC_OR_DEPARTMENT_OTHER): Payer: Self-pay | Admitting: *Deleted

## 2014-11-05 ENCOUNTER — Emergency Department (HOSPITAL_BASED_OUTPATIENT_CLINIC_OR_DEPARTMENT_OTHER)
Admission: EM | Admit: 2014-11-05 | Discharge: 2014-11-05 | Disposition: A | Discharge status: Home / Self Care | Payer: Self-pay | Attending: Emergency Medicine | Admitting: Emergency Medicine

## 2014-11-05 ENCOUNTER — Emergency Department (HOSPITAL_BASED_OUTPATIENT_CLINIC_OR_DEPARTMENT_OTHER): Payer: Self-pay

## 2014-11-05 DIAGNOSIS — R05 Cough: Secondary | ICD-10-CM

## 2014-11-05 DIAGNOSIS — R059 Cough, unspecified: Secondary | ICD-10-CM

## 2014-11-05 DIAGNOSIS — Z79899 Other long term (current) drug therapy: Secondary | ICD-10-CM | POA: Insufficient documentation

## 2014-11-05 DIAGNOSIS — Z791 Long term (current) use of non-steroidal anti-inflammatories (NSAID): Secondary | ICD-10-CM | POA: Insufficient documentation

## 2014-11-05 DIAGNOSIS — J111 Influenza due to unidentified influenza virus with other respiratory manifestations: Secondary | ICD-10-CM

## 2014-11-05 DIAGNOSIS — Z792 Long term (current) use of antibiotics: Secondary | ICD-10-CM | POA: Insufficient documentation

## 2014-11-05 DIAGNOSIS — R69 Illness, unspecified: Secondary | ICD-10-CM

## 2014-11-05 MED ORDER — OSELTAMIVIR PHOSPHATE 75 MG PO CAPS: 75.0000 mg | ORAL_CAPSULE | Freq: Two times a day (BID) | ORAL | Status: DC

## 2014-11-05 MED ORDER — ALBUTEROL SULFATE HFA 108 (90 BASE) MCG/ACT IN AERS
2.0000 | INHALATION_SPRAY | RESPIRATORY_TRACT | Status: DC | PRN
  Administered 2014-11-05: 2 via RESPIRATORY_TRACT
  Filled 2014-11-05: qty 6.7

## 2014-11-05 MED ORDER — ACETAMINOPHEN 325 MG PO TABS
650.0000 mg | ORAL_TABLET | Freq: Once | ORAL | Status: AC
  Administered 2014-11-05: 650 mg via ORAL
  Filled 2014-11-05: qty 2

## 2014-11-05 NOTE — ED Provider Notes (Signed)
CSN: [id number]     Arrival date & time 11/05/14  1514 History  This chart was scribed for Cynthia ScottMartha Linker, MD by Roxy Cedarhandni Bhalodia, ED Scribe. This patient was seen in room MH06/MH06 and the patient's care was started at 4:51 PM.  Chief Complaint  Patient presents with  . Cough   Patient is a 29 y.o. female presenting with cough. The history is provided by the patient. No language interpreter was used.  Cough Cough characteristics:  Non-productive Severity:  Moderate Onset quality:  Gradual Timing:  Intermittent Progression:  Waxing and waning Chronicity:  New Smoker: no   Relieved by:  Nothing Worsened by:  Nothing tried Associated symptoms: chills, fever and sore throat     HPI Comments: Cynthia Petty is a 29 y.o. female with no medical conditions, who presents to the Emergency Department complaining of moderate cough, sore throat, generalized body aches, congestion, subjective fever, chills, and weakness that began 2 days ago. Patient reports associated loss of appetite and nausea that began this morning. She states that she took ibuprofen, robitussin, and tylenol with minimal relief.  History reviewed. No pertinent past medical history. History reviewed. No pertinent past surgical history. No family history on file. History  Substance Use Topics  . Smoking status: Never Smoker   . Smokeless tobacco: Never Used  . Alcohol Use: No   OB History    No data available     Review of Systems  Constitutional: Positive for fever, chills and fatigue.  HENT: Positive for congestion and sore throat.   Respiratory: Positive for cough.   All other systems reviewed and are negative.  Allergies  Review of patient's allergies indicates no known allergies.  Home Medications   Prior to Admission medications   Medication Sig Start Date End Date Taking? Authorizing Provider  azithromycin (ZITHROMAX Z-PAK) 250 MG tablet Take 2 tablets PO today and then one daily for infection 09/29/13    [facility] Or[facility]M Neese, NP  cyclobenzaprine (FLEXERIL) 10 MG tablet Take 1 tablet (10 mg total) by mouth 2 (two) times daily as needed for muscle spasms. 02/03/14   Courtney Forcucci, PA-C  guaifenesin (ROBITUSSIN) 100 MG/5ML syrup Take 5-10 mLs (100-200 mg total) by mouth every 4 (four) hours as needed for cough. 09/29/13   Hope Orlene OchM Neese, NP  HYDROcodone-acetaminophen (NORCO/VICODIN) 5-325 MG per tablet Take 2 tablets by mouth every 4 (four) hours as needed for pain. 08/26/12   Elson AreasLeslie K Sofia, PA-C  HYDROcodone-acetaminophen (NORCO/VICODIN) 5-325 MG per tablet Take one tablet PO every 4 hours as needed for cough 09/29/13   [facility], NP  ibuprofen (ADVIL,MOTRIN) 800 MG tablet Take 1 tablet (800 mg total) by mouth 3 (three) times daily. 08/26/12   Elson AreasLeslie K Sofia, PA-C  levonorgestrel (MIRENA) 20 MCG/24HR IUD 1 each by Intrauterine route once. Inserted in 2010    Historical Provider, MD  omeprazole (PRILOSEC) 20 MG capsule Take 1 capsule (20 mg total) by mouth daily. 01/20/12 01/19/13  Rolan BuccoMelanie Belfi, MD  oseltamivir (TAMIFLU) 75 MG capsule Take 1 capsule (75 mg total) by mouth every 12 (twelve) hours. 11/05/14   Cynthia ScottMartha Linker, MD  traMADol (ULTRAM) 50 MG tablet Take 1 tablet (50 mg total) by mouth every 6 (six) hours as needed for moderate pain. 02/03/14   Terri Piedraourtney Forcucci, PA-C   Triage Vitals: BP 114/65 mmHg  Pulse 82  Temp(Src) 100.1 F (37.8 C) (Oral)  Resp 18  Ht 5\' 2"  (1.575 m)  Wt 240 lb (108.863 kg)  BMI  43.89 kg/m2  SpO2 98% Vitals reviewed Physical Exam  Physical Examination: General appearance - alert, well appearing, and in no distress Mental status - alert, oriented to person, place, and time Eyes - no conjunctival injection, no scleral icterus Mouth - mucous membranes moist, pharynx normal without lesions Chest - clear to auscultation, no wheezes, rales or rhonchi, symmetric air entry, normal respiratory effort Heart - normal rate, regular rhythm, normal S1, S2, no murmurs, rubs, clicks or  gallops Abdomen - soft, nontender, nondistended, no masses or organomegaly Extremities - peripheral pulses normal, no pedal edema, no clubbing or cyanosis Skin - normal coloration and turgor, no rashes  ED Course  Procedures (including critical care time)  DIAGNOSTIC STUDIES: Oxygen Saturation is 98% on RA, normal by my interpretation.    COORDINATION OF CARE: 4:53 PM- Discussed plans to order diagnostic CXR. Will give patient tylenol and albuterol breathing treatment. Pt advised of plan for treatment and pt agrees.  Labs Review Labs Reviewed - No data to display  Imaging Review Dg Chest 2 View  11/05/2014   CLINICAL DATA:  Two day history of cough, chest congestion, fever, chills, nausea, sore throat and generalized body aches.  EXAM: CHEST  2 VIEW  COMPARISON:  02/03/2014 and earlier.  FINDINGS: Suboptimal inspiration due to body habitus accounts for crowded bronchovascular markings, especially in the bases, and accentuates the cardiac silhouette. Taking this into account, cardiomediastinal silhouette unremarkable and unchanged. Lungs clear. Bronchovascular markings normal. Pulmonary vascularity normal. No visible pleural effusions. No pneumothorax. Visualized bony thorax intact.  IMPRESSION: Suboptimal inspiration.  No acute cardiopulmonary disease.   Electronically Signed   By: Hulan Saas M.D.   On: 11/05/2014 17:13     EKG Interpretation None     MDM   Final diagnoses:  Cough  Influenza-like illness    Pt presenting with cough, body aches, fever.  Symptoms  Most c/w viral syndrome could be influenza- she is within the window that tamiflu may be helpful so rx given.  CXR reassuring without pneumonia.  Discharged with strict return precautions.  Pt agreeable with plan.  I personally performed the services described in this documentation, which was scribed in my presence. The recorded information has been reviewed and is accurate.   Cynthia Scott, MD 11/07/14 [id number]

## 2014-11-05 NOTE — Discharge Instructions (Signed)
Return to the ED with any concerns including difficulty breathing despite using albuterol every 4 hours, not drinking fluids, decreased urine output, vomiting and not able to keep down liquids or medications, decreased level of alertness/lethargy, or any other alarming symptoms °

## 2014-11-05 NOTE — ED Notes (Signed)
Patient states that for the past few days she has had chills, weakness, congestion, cough, generalized aches

## 2015-02-23 ENCOUNTER — Emergency Department (HOSPITAL_BASED_OUTPATIENT_CLINIC_OR_DEPARTMENT_OTHER): Payer: Self-pay

## 2015-02-23 ENCOUNTER — Emergency Department (HOSPITAL_BASED_OUTPATIENT_CLINIC_OR_DEPARTMENT_OTHER)
Admission: EM | Admit: 2015-02-23 | Discharge: 2015-02-23 | Disposition: A | Discharge status: Home / Self Care | Payer: Self-pay | Attending: Emergency Medicine | Admitting: Emergency Medicine

## 2015-02-23 ENCOUNTER — Encounter (HOSPITAL_BASED_OUTPATIENT_CLINIC_OR_DEPARTMENT_OTHER): Payer: Self-pay | Admitting: *Deleted

## 2015-02-23 DIAGNOSIS — Z3202 Encounter for pregnancy test, result negative: Secondary | ICD-10-CM | POA: Insufficient documentation

## 2015-02-23 DIAGNOSIS — M7989 Other specified soft tissue disorders: Secondary | ICD-10-CM

## 2015-02-23 DIAGNOSIS — R600 Localized edema: Secondary | ICD-10-CM | POA: Insufficient documentation

## 2015-02-23 DIAGNOSIS — M25561 Pain in right knee: Secondary | ICD-10-CM | POA: Insufficient documentation

## 2015-02-23 LAB — CBC WITH DIFFERENTIAL/PLATELET
BASOS ABS: 0 10*3/uL (ref 0.0–0.1)
Basophils Relative: 0 % (ref 0–1)
EOS ABS: 0.1 10*3/uL (ref 0.0–0.7)
EOS PCT: 2 % (ref 0–5)
HEMATOCRIT: 36.7 % (ref 36.0–46.0)
HEMOGLOBIN: 12.3 g/dL (ref 12.0–15.0)
LYMPHS PCT: 43 % (ref 12–46)
Lymphs Abs: 2.9 10*3/uL (ref 0.7–4.0)
MCH: 31 pg (ref 26.0–34.0)
MCHC: 33.5 g/dL (ref 30.0–36.0)
MCV: 92.4 fL (ref 78.0–100.0)
Monocytes Absolute: 0.4 10*3/uL (ref 0.1–1.0)
Monocytes Relative: 5 % (ref 3–12)
NEUTROS PCT: 50 % (ref 43–77)
Neutro Abs: 3.3 10*3/uL (ref 1.7–7.7)
Platelets: 248 10*3/uL (ref 150–400)
RBC: 3.97 MIL/uL (ref 3.87–5.11)
RDW: 11.9 % (ref 11.5–15.5)
WBC: 6.7 10*3/uL (ref 4.0–10.5)

## 2015-02-23 LAB — BASIC METABOLIC PANEL
Anion gap: 7 (ref 5–15)
BUN: 13 mg/dL (ref 6–20)
CHLORIDE: 103 mmol/L (ref 101–111)
CO2: 27 mmol/L (ref 22–32)
CREATININE: 0.8 mg/dL (ref 0.44–1.00)
Calcium: 9.7 mg/dL (ref 8.9–10.3)
GFR calc Af Amer: >60 mL/min (ref >60)
GFR calc non Af Amer: >60 mL/min (ref >60)
GLUCOSE: 95 mg/dL (ref 65–99)
POTASSIUM: 3.4 mmol/L — AB (ref 3.5–5.1)
Sodium: 137 mmol/L (ref 135–145)

## 2015-02-23 LAB — PREGNANCY, URINE: PREG TEST UR: NEGATIVE

## 2015-02-23 MED ORDER — POTASSIUM CHLORIDE ER 20 MEQ PO TBCR
20.0000 meq | EXTENDED_RELEASE_TABLET | Freq: Every day | ORAL | Status: AC
Start: 2015-02-23 — End: ?

## 2015-02-23 MED ORDER — FUROSEMIDE 20 MG PO TABS: 20.0000 mg | ORAL_TABLET | Freq: Every day | ORAL | Status: DC

## 2015-02-23 MED ORDER — KETOROLAC TROMETHAMINE 60 MG/2ML IM SOLN
60.0000 mg | Freq: Once | INTRAMUSCULAR | Status: AC
  Administered 2015-02-23: 60 mg via INTRAMUSCULAR
  Filled 2015-02-23: qty 2

## 2015-02-23 NOTE — ED Provider Notes (Signed)
CSN: [id number]     Arrival date & time 02/23/15  1552 History   First MD Initiated Contact with Patient 02/23/15 1615     No chief complaint on file.    The history is provided by the patient. No language interpreter was used.   Ms. Cynthia Petty presents for evaluation of leg swelling. She states that she's had increased swelling and tightness to bilateral lower extremities, right more than left. This has been ongoing for the last 5 days and is worsening. She also reports increased pain and tightness in her right knee and difficulty with flexion due to discomfort and full sensation. She denies any fevers, chest pain, shortness of breath. She has no similar symptoms previously. 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 No recent dietary changes. She does have a Mirena IUD. She denies any history of DVT or PE. She is a nonsmoker. She denies any injury to her legs.  History reviewed. No pertinent past medical history. History reviewed. No pertinent past surgical history. No family history on file. History  Substance Use Topics  . Smoking status: Never Smoker   . Smokeless tobacco: Never Used  . Alcohol Use: No   OB History    No data available     Review of Systems  All other systems reviewed and are negative.     Allergies  Review of patient's allergies indicates no known allergies.  Home Medications   Prior to Admission medications   Medication Sig Start Date End Date Taking? Authorizing Provider  levonorgestrel (MIRENA) 20 MCG/24HR IUD 1 each by Intrauterine route once. Inserted in 2010   Yes Historical Provider, MD   BP 103/50 mmHg  Pulse 85  Temp(Src) 98.1 F (36.7 C) (Oral)  Resp 18  Ht 5\' 2"  (1.575 m)  Wt 238 lb (107.956 kg)  BMI 43.52 kg/m2  SpO2 100% Physical Exam  Constitutional: She is oriented to person, place, and time. She appears well-developed and well-nourished.  HENT:  Head: Normocephalic and atraumatic.  Cardiovascular: Normal rate  and regular rhythm.   No murmur heard. Pulmonary/Chest: Effort normal and breath sounds normal. No respiratory distress.  Abdominal: Soft. There is no tenderness. There is no rebound and no guarding.  Musculoskeletal:  Mild tenderness to the right anterior knee. 2+ DP pulses bilaterally. No cellulitis. 1+ pitting edema of bilateral lower extremities.  Neurological: She is alert and oriented to person, place, and time.  Skin: Skin is warm and dry.  Psychiatric: She has a normal mood and affect. Her behavior is normal.  Nursing note and vitals reviewed.   ED Course  Procedures (including critical care time) Labs Review Labs Reviewed  BASIC METABOLIC PANEL - Abnormal; Notable for the following:    Potassium 3.4 (*)    All other components within normal limits  PREGNANCY, URINE  CBC WITH DIFFERENTIAL/PLATELET    Imaging Review [place] Venous Img Lower Unilateral Right  02/23/2015   CLINICAL DATA:  RIGHT knee pain. Initial encounter. Deep venous thrombosis. Symptoms for 4 days. No injury.  EXAM: RIGHT LOWER EXTREMITY VENOUS DOPPLER ULTRASOUND  TECHNIQUE: Gray-scale sonography with graded compression, as well as color Doppler and duplex ultrasound were performed to evaluate the lower extremity deep venous systems from the level of the common femoral vein and including the common femoral, femoral, profunda femoral, popliteal and calf veins including the posterior tibial, peroneal and gastrocnemius veins when visible. The superficial great saphenous vein was also interrogated. Spectral Doppler was utilized  to evaluate flow at rest and with distal augmentation maneuvers in the common femoral, femoral and popliteal veins.  COMPARISON:  None.  FINDINGS: Contralateral Common Femoral Vein: Respiratory phasicity is normal and symmetric with the symptomatic side. No evidence of thrombus. Normal compressibility.  Common Femoral Vein: No evidence of thrombus. Normal compressibility, respiratory phasicity and  response to augmentation.  Saphenofemoral Junction: No evidence of thrombus. Normal compressibility and flow on color Doppler imaging.  Profunda Femoral Vein: No evidence of thrombus. Normal compressibility and flow on color Doppler imaging.  Femoral Vein: No evidence of thrombus. Normal compressibility, respiratory phasicity and response to augmentation.  Popliteal Vein: No evidence of thrombus. Normal compressibility, respiratory phasicity and response to augmentation.  Calf Veins: No evidence of thrombus. Normal compressibility and flow on color Doppler imaging. Suboptimal visualization of the calf veins due to obese body habitus (238 lb)  Superficial Great Saphenous Vein: No evidence of thrombus. Normal compressibility and flow on color Doppler imaging.  Venous Reflux:  None.  Other Findings:  None.  IMPRESSION: No evidence of deep venous thrombosis.   Electronically Signed   By: Andreas Newport M.D.   On: 02/23/2015 17:57   Dg Knee Complete 4 Views Right  02/23/2015   CLINICAL DATA:  29 year old female with bilateral knee and ankle swelling x3 days. No injury  EXAM: RIGHT KNEE - COMPLETE 4+ VIEW  COMPARISON:  None.  FINDINGS: No acute fracture or dislocation. Small suprapatellar effusion. The joint space is maintained. The soft tissues are unremarkable.  IMPRESSION: Small suprapatellar effusion.  No other acute findings.   Electronically Signed   By: Elgie Collard M.D.   On: 02/23/2015 18:02     EKG Interpretation None      MDM   Final diagnoses:  Bilateral edema of lower extremity  Arthralgia of knee, right    Patient here for evaluation of leg swelling and pain. Clinical picture is not consistent with CHF, acute renal failure. Patient with Mirena IUD, DVT study obtained to rule out DVT, this was negative. X-ray does note a suprapatellar effusion. Patient denies any history of trauma. Exam is not consistent with septic arthritis or gouty arthritis. Discussed with patient home care for leg  swelling as well as knee pain. Discussed return precautions.  Tilden Fossa, MD 02/23/15 2337

## 2015-02-23 NOTE — ED Notes (Signed)
C/o leg swelling from knees to feet. States right leg has more swelling than left. No SOB. Onset Sunday. No injury.

## 2015-02-23 NOTE — Discharge Instructions (Signed)
Arthralgia °Your caregiver has diagnosed you as suffering from an arthralgia. Arthralgia means there is pain in a joint. This can come from many reasons including: °· Bruising the joint which causes soreness (inflammation) in the joint. °· Wear and tear on the joints which occur as we grow older (osteoarthritis). °· Overusing the joint. °· Various forms of arthritis. °· Infections of the joint. °Regardless of the cause of pain in your joint, most of these different pains respond to anti-inflammatory drugs and rest. The exception to this is when a joint is infected, and these cases are treated with antibiotics, if it is a bacterial infection. °HOME CARE INSTRUCTIONS  °· Rest the injured area for as long as directed by your caregiver. Then slowly start using the joint as directed by your caregiver and as the pain allows. Crutches as directed may be useful if the ankles, knees or hips are involved. If the knee was splinted or casted, continue use and care as directed. If an stretchy or elastic wrapping bandage has been applied today, it should be removed and re-applied every 3 to 4 hours. It should not be applied tightly, but firmly enough to keep swelling down. Watch toes and feet for swelling, bluish discoloration, coldness, numbness or excessive pain. If any of these problems (symptoms) occur, remove the ace bandage and re-apply more loosely. If these symptoms persist, contact your caregiver or return to this location. °· For the first 24 hours, keep the injured extremity elevated on pillows while lying down. °· Apply ice for 15-20 minutes to the sore joint every couple hours while awake for the first half day. Then 03-04 times per day for the first 48 hours. Put the ice in a plastic bag and place a towel between the bag of ice and your skin. °· Wear any splinting, casting, elastic bandage applications, or slings as instructed. °· Only take over-the-counter or prescription medicines for pain, discomfort, or fever as  directed by your caregiver. Do not use aspirin immediately after the injury unless instructed by your physician. Aspirin can cause increased bleeding and bruising of the tissues. °· If you were given crutches, continue to use them as instructed and do not resume weight bearing on the sore joint until instructed. °Persistent pain and inability to use the sore joint as directed for more than 2 to 3 days are warning signs indicating that you should see a caregiver for a follow-up visit as soon as possible. Initially, a hairline fracture (break in bone) may not be evident on X-rays. Persistent pain and swelling indicate that further evaluation, non-weight bearing or use of the joint (use of crutches or slings as instructed), or further X-rays are indicated. X-rays may sometimes not show a small fracture until a week or 10 days later. Make a follow-up appointment with your own caregiver or one to whom we have referred you. A radiologist (specialist in reading X-rays) may read your X-rays. Make sure you know how you are to obtain your X-ray results. Do not assume everything is normal if you do not hear from us. °SEEK MEDICAL CARE IF: °Bruising, swelling, or pain increases. °SEEK IMMEDIATE MEDICAL CARE IF:  °· Your fingers or toes are numb or blue. °· The pain is not responding to medications and continues to stay the same or get worse. °· The pain in your joint becomes severe. °· You develop a fever over 102° F (38.9° C). °· It becomes impossible to move or use the joint. °MAKE SURE YOU:  °·   Understand these instructions.  Will watch your condition.  Will get help right away if you are not doing well or get worse. Document Released: 08/11/2005 Document Revised: 11/03/2011 Document Reviewed: 03/29/2008 [facility] Patient Information 2015 [place], [place]. This information is not intended to replace advice given to you by your health care provider. Make sure you discuss any questions you have with your health care  provider.  Edema Edema is an abnormal buildup of fluids in your bodytissues. Edema is somewhatdependent on gravity to pull the fluid to the lowest place in your body. That makes the condition more common in the legs and thighs (lower extremities). Painless swelling of the feet and ankles is common and becomes more likely as you get older. It is also common in looser tissues, like around your eyes.  When the affected area is squeezed, the fluid may move out of that spot and leave a dent for a few moments. This dent is called pitting.  CAUSES  There are many possible causes of edema. Eating too much salt and being on your feet or sitting for a long time can cause edema in your legs and ankles. Hot weather may make edema worse. Common medical causes of edema include:  Heart failure.  Liver disease.  Kidney disease.  Weak blood vessels in your legs.  Cancer.  An injury.  Pregnancy.  Some medications.  Obesity. SYMPTOMS  Edema is usually painless.Your skin may look swollen or shiny.  DIAGNOSIS  Your health care provider may be able to diagnose edema by asking about your medical history and doing a physical exam. You may need to have tests such as X-rays, an electrocardiogram, or blood tests to check for medical conditions that may cause edema.  TREATMENT  Edema treatment depends on the cause. If you have heart, liver, or kidney disease, you need the treatment appropriate for these conditions. General treatment may include:  Elevation of the affected body part above the level of your heart.  Compression of the affected body part. Pressure from elastic bandages or support stockings squeezes the tissues and forces fluid back into the blood vessels. This keeps fluid from entering the tissues.  Restriction of fluid and salt intake.  Use of a water pill (diuretic). These medications are appropriate only for some types of edema. They pull fluid out of your body and make you urinate more  often. This gets rid of fluid and reduces swelling, but diuretics can have side effects. Only use diuretics as directed by your health care provider. HOME CARE INSTRUCTIONS   Keep the affected body part above the level of your heart when you are lying down.   Do not sit still or stand for prolonged periods.   Do not put anything directly under your knees when lying down.  Do not wear constricting clothing or garters on your upper legs.   Exercise your legs to work the fluid back into your blood vessels. This may help the swelling go down.   Wear elastic bandages or support stockings to reduce ankle swelling as directed by your health care provider.   Eat a low-salt diet to reduce fluid if your health care provider recommends it.   Only take medicines as directed by your health care provider. SEEK MEDICAL CARE IF:   Your edema is not responding to treatment.  You have heart, liver, or kidney disease and notice symptoms of edema.  You have edema in your legs that does not improve after elevating them.  You have sudden and unexplained weight gain. SEEK IMMEDIATE MEDICAL CARE IF:   You develop shortness of breath or chest pain.   You cannot breathe when you lie down.  You develop pain, redness, or warmth in the swollen areas.   You have heart, liver, or kidney disease and suddenly get edema.  You have a fever and your symptoms suddenly get worse. MAKE SURE YOU:   Understand these instructions.  Will watch your condition.  Will get help right away if you are not doing well or get worse. Document Released: 08/11/2005 Document Revised: 12/26/2013 Document Reviewed: 06/03/2013 [facility] Patient Information 2015 [place], [place]. This information is not intended to replace advice given to you by your health care provider. Make sure you discuss any questions you have with your health care provider.

## 2015-02-23 NOTE — ED Notes (Signed)
Pt. Just returned from Radiology and is back to room 5.

## 2015-03-24 ENCOUNTER — Encounter (HOSPITAL_BASED_OUTPATIENT_CLINIC_OR_DEPARTMENT_OTHER): Payer: Self-pay | Admitting: *Deleted

## 2015-03-24 DIAGNOSIS — R05 Cough: Secondary | ICD-10-CM | POA: Insufficient documentation

## 2015-03-24 DIAGNOSIS — Z79899 Other long term (current) drug therapy: Secondary | ICD-10-CM | POA: Insufficient documentation

## 2015-03-24 DIAGNOSIS — W57XXXA Bitten or stung by nonvenomous insect and other nonvenomous arthropods, initial encounter: Secondary | ICD-10-CM | POA: Insufficient documentation

## 2015-03-24 DIAGNOSIS — Y9389 Activity, other specified: Secondary | ICD-10-CM | POA: Insufficient documentation

## 2015-03-24 DIAGNOSIS — L509 Urticaria, unspecified: Secondary | ICD-10-CM | POA: Insufficient documentation

## 2015-03-24 DIAGNOSIS — L03113 Cellulitis of right upper limb: Secondary | ICD-10-CM | POA: Insufficient documentation

## 2015-03-24 DIAGNOSIS — K088 Other specified disorders of teeth and supporting structures: Secondary | ICD-10-CM | POA: Insufficient documentation

## 2015-03-24 DIAGNOSIS — S60561A Insect bite (nonvenomous) of right hand, initial encounter: Secondary | ICD-10-CM | POA: Insufficient documentation

## 2015-03-24 DIAGNOSIS — Y9289 Other specified places as the place of occurrence of the external cause: Secondary | ICD-10-CM | POA: Insufficient documentation

## 2015-03-24 DIAGNOSIS — Y998 Other external cause status: Secondary | ICD-10-CM | POA: Insufficient documentation

## 2015-03-24 DIAGNOSIS — R0981 Nasal congestion: Secondary | ICD-10-CM | POA: Insufficient documentation

## 2015-03-24 NOTE — ED Notes (Signed)
Pt has itching and painful area on her right upper arm which she feels is from an insect bite.  Pt also states that she has dental pain and a chronic URI which she would like evaluated

## 2015-03-25 ENCOUNTER — Emergency Department (HOSPITAL_BASED_OUTPATIENT_CLINIC_OR_DEPARTMENT_OTHER)
Admission: EM | Admit: 2015-03-25 | Discharge: 2015-03-25 | Disposition: A | Discharge status: Home / Self Care | Payer: Self-pay | Attending: Emergency Medicine | Admitting: Emergency Medicine

## 2015-03-25 DIAGNOSIS — L03113 Cellulitis of right upper limb: Secondary | ICD-10-CM

## 2015-03-25 DIAGNOSIS — L509 Urticaria, unspecified: Secondary | ICD-10-CM

## 2015-03-25 DIAGNOSIS — W57XXXA Bitten or stung by nonvenomous insect and other nonvenomous arthropods, initial encounter: Secondary | ICD-10-CM

## 2015-03-25 DIAGNOSIS — K0889 Other specified disorders of teeth and supporting structures: Secondary | ICD-10-CM

## 2015-03-25 HISTORY — DX: Morbid (severe) obesity due to excess calories: E66.01

## 2015-03-25 MED ORDER — DIPHENHYDRAMINE HCL 25 MG PO CAPS: 25.0000 mg | ORAL_CAPSULE | Freq: Three times a day (TID) | ORAL | Status: AC | PRN

## 2015-03-25 MED ORDER — FAMOTIDINE 20 MG PO TABS
20.0000 mg | ORAL_TABLET | Freq: Once | ORAL | Status: AC
  Administered 2015-03-25: 20 mg via ORAL
  Filled 2015-03-25: qty 1

## 2015-03-25 MED ORDER — HYDROCODONE-ACETAMINOPHEN 5-325 MG PO TABS
1.0000 | ORAL_TABLET | Freq: Once | ORAL | Status: AC
  Administered 2015-03-25: 1 via ORAL
  Filled 2015-03-25: qty 1

## 2015-03-25 MED ORDER — FAMOTIDINE 20 MG PO TABS: 20.0000 mg | ORAL_TABLET | Freq: Every day | ORAL | Status: DC

## 2015-03-25 MED ORDER — DIPHENHYDRAMINE HCL 25 MG PO CAPS
25.0000 mg | ORAL_CAPSULE | Freq: Once | ORAL | Status: AC
  Administered 2015-03-25: 25 mg via ORAL
  Filled 2015-03-25: qty 1

## 2015-03-25 MED ORDER — CLINDAMYCIN HCL 150 MG PO CAPS: 300.0000 mg | ORAL_CAPSULE | Freq: Three times a day (TID) | ORAL | Status: DC

## 2015-03-25 NOTE — Discharge Instructions (Signed)
You were seen today for multiple reasons. It appears she had an insect bite that you are having an allergic reaction to. You also may have an overlying infection. You have given antibiotics and anti-histamines. The antibiotics would also cover for any dental infection. You can take ibuprofen every 6 hours as needed for dental pain and should follow-up with her dentist.  Dental Pain A tooth ache may be caused by cavities (tooth decay). Cavities expose the nerve of the tooth to air and hot or cold temperatures. It may come from an infection or abscess (also called a boil or furuncle) around your tooth. It is also often caused by dental caries (tooth decay). This causes the pain you are having. DIAGNOSIS  Your caregiver can diagnose this problem by exam. TREATMENT   If caused by an infection, it may be treated with medications which kill germs (antibiotics) and pain medications as prescribed by your caregiver. Take medications as directed.  Only take over-the-counter or prescription medicines for pain, discomfort, or fever as directed by your caregiver.  Whether the tooth ache today is caused by infection or dental disease, you should see your dentist as soon as possible for further care. SEEK MEDICAL CARE IF: The exam and treatment you received today has been provided on an emergency basis only. This is not a substitute for complete medical or dental care. If your problem worsens or new problems (symptoms) appear, and you are unable to meet with your dentist, call or return to this location. SEEK IMMEDIATE MEDICAL CARE IF:   You have a fever.  You develop redness and swelling of your face, jaw, or neck.  You are unable to open your mouth.  You have severe pain uncontrolled by pain medicine. MAKE SURE YOU:   Understand these instructions.  Will watch your condition.  Will get help right away if you are not doing well or get worse. Document Released: 08/11/2005 Document Revised: 11/03/2011  Document Reviewed: 03/29/2008 [facility] Patient Information 2015 [place], [place]. This information is not intended to replace advice given to you by your health care provider. Make sure you discuss any questions you have with your health care provider.   Cellulitis Cellulitis is an infection of the skin and the tissue beneath it. The infected area is usually red and tender. Cellulitis occurs most often in the arms and lower legs.  CAUSES  Cellulitis is caused by bacteria that enter the skin through cracks or cuts in the skin. The most common types of bacteria that cause cellulitis are staphylococci and streptococci. SIGNS AND SYMPTOMS   Redness and warmth.  Swelling.  Tenderness or pain.  Fever. DIAGNOSIS  Your health care provider can usually determine what is wrong based on a physical exam. Blood tests may also be done. TREATMENT  Treatment usually involves taking an antibiotic medicine. HOME CARE INSTRUCTIONS   Take your antibiotic medicine as directed by your health care provider. Finish the antibiotic even if you start to feel better.  Keep the infected arm or leg elevated to reduce swelling.  Apply a warm cloth to the affected area up to 4 times per day to relieve pain.  Take medicines only as directed by your health care provider.  Keep all follow-up visits as directed by your health care provider. SEEK MEDICAL CARE IF:   You notice red streaks coming from the infected area.  Your red area gets larger or turns dark in color.  Your bone or joint underneath the infected area becomes painful  after the skin has healed.  Your infection returns in the same area or another area.  You notice a swollen bump in the infected area.  You develop new symptoms.  You have a fever. SEEK IMMEDIATE MEDICAL CARE IF:   You feel very sleepy.  You develop vomiting or diarrhea.  You have a general ill feeling (malaise) with muscle aches and pains. MAKE SURE YOU:   Understand  these instructions.  Will watch your condition.  Will get help right away if you are not doing well or get worse. Document Released: 05/21/2005 Document Revised: 12/26/2013 Document Reviewed: 10/27/2011 [facility] Patient Information 2015 [place], [place]. This information is not intended to replace advice given to you by your health care provider. Make sure you discuss any questions you have with your health care provider. Hives Hives are itchy, red, swollen areas of the skin. They can vary in size and location on your body. Hives can come and go for hours or several days (acute hives) or for several weeks (chronic hives). Hives do not spread from person to person (noncontagious). They may get worse with scratching, exercise, and emotional stress. CAUSES   Allergic reaction to food, additives, or drugs.  Infections, including the common cold.  Illness, such as vasculitis, lupus, or thyroid disease.  Exposure to sunlight, heat, or cold.  Exercise.  Stress.  Contact with chemicals. SYMPTOMS   Red or white swollen patches on the skin. The patches may change size, shape, and location quickly and repeatedly.  Itching.  Swelling of the hands, feet, and face. This may occur if hives develop deeper in the skin. DIAGNOSIS  Your caregiver can usually tell what is wrong by performing a physical exam. Skin or blood tests may also be done to determine the cause of your hives. In some cases, the cause cannot be determined. TREATMENT  Mild cases usually get better with medicines such as antihistamines. Severe cases may require an emergency epinephrine injection. If the cause of your hives is known, treatment includes avoiding that trigger.  HOME CARE INSTRUCTIONS   Avoid causes that trigger your hives.  Take antihistamines as directed by your caregiver to reduce the severity of your hives. Non-sedating or low-sedating antihistamines are usually recommended. Do not drive while taking an  antihistamine.  Take any other medicines prescribed for itching as directed by your caregiver.  Wear loose-fitting clothing.  Keep all follow-up appointments as directed by your caregiver. SEEK MEDICAL CARE IF:   You have persistent or severe itching that is not relieved with medicine.  You have painful or swollen joints. SEEK IMMEDIATE MEDICAL CARE IF:   You have a fever.  Your tongue or lips are swollen.  You have trouble breathing or swallowing.  You feel tightness in the throat or chest.  You have abdominal pain. These problems may be the first sign of a life-threatening allergic reaction. Call your local emergency services (911 in [place].). MAKE SURE YOU:   Understand these instructions.  Will watch your condition.  Will get help right away if you are not doing well or get worse. Document Released: 08/11/2005 Document Revised: 08/16/2013 Document Reviewed: 11/04/2011 [facility] Patient Information 2015 [place], [place]. This information is not intended to replace advice given to you by your health care provider. Make sure you discuss any questions you have with your health care provider.   Emergency Department Resource Guide 1) Find a Doctor and Pay Out of Pocket Although you won't have to find out who is covered by  your insurance plan, it is a good idea to ask around and get recommendations. You will then need to call the office and see if the doctor you have chosen will accept you as a new patient and what types of options they offer for patients who are self-pay. Some doctors offer discounts or will set up payment plans for their patients who do not have insurance, but you will need to ask so you aren't surprised when you get to your appointment.  2) Contact Your Local Health Department Not all health departments have doctors that can see patients for sick visits, but many do, so it is worth a call to see if yours does. If you don't know where your local health department  is, you can check in your phone book. The CDC also has a tool to help you locate your state's health department, and many state websites also have listings of all of their local health departments.  3) Find a Walk-in Clinic If your illness is not likely to be very severe or complicated, you may want to try a walk in clinic. These are popping up all over the country in pharmacies, drugstores, and shopping centers. They're usually staffed by nurse practitioners or physician assistants that have been trained to treat common illnesses and complaints. They're usually fairly quick and inexpensive. However, if you have serious medical issues or chronic medical problems, these are probably not your best option.  No Primary Care Doctor: - Call Health Connect at  [phone] - they can help you locate a primary care doctor that  accepts your insurance, provides certain services, etc. - Physician Referral Service- [phone]  Chronic Pain Problems: Organization         Address  Phone   Notes  [facility]  [phone] Patients need to be referred by their primary care doctor.   Medication Assistance: Organization         Address  Phone   Notes  [facility] Medication [facility] [address] [place] [place], [place] [address] [phone] --Must be a resident of [facility] -- Must have NO insurance coverage whatsoever (no Medicaid/ Medicare, etc.) -- The pt. MUST have a primary care doctor that directs their care regularly and follows them in the community   [facility]ist  [phone]   [employer]  [phone]    Agencies that provide inexpensive medical care: Organization         Address  Phone   Notes  [facility]  [phone]   [facility]    [phone]   [facility] [address] [place], [place] [address] [phone]   [facility] [address] [place], [place]  [phone]   Planned Parenthood    [phone]   [facility]    [phone]   [facility]  [address], [place] Phone:  [phone], Fax:  [phone] Hours of Operation:  9 am - 6 pm, M-F.  Also accepts Medicaid/Medicare and self-pay.  [facility]  [address], [place] Phone: [phone], Fax: [phone]. Hours of Operation:  8:30 am - 5:30 pm, M-F.  Also accepts Medicaid and self-pay.  [facility] [place] [address], [place] Phone: [phone]   [facility] [address] Natasha Bence [place], [place] [phone], Ext. [phone] Mondays &  Thursdays: 7-9 AM.  First 15 patients are seen on a first come, first serve basis.    Medicaid-accepting [facility] Providers:  Organization         Address  Phone   Notes  [facility] [address], [place] [phone] Also accepts self-pay patients.  [facility] [address] Laurell Josephs [place], [place]  [phone]   [facility] [address], [place] [phone]   [facility] [address], [place] [phone]   Renaye Rakers [address], [place]   [phone] Only accepts [place] Access [place] patients after they have their name applied to their card.   Self-Pay (no insurance) in [facility]:  Organization         Address  Phone   Notes  Sickle Cell Patients, [facility] Medicine [address] [place], [place] [phone]   [facility] Urgent Care [address] [place], [place] [phone]   [facility] [place]  [address], [place] [phone]   [facility]/Dr. Osei-Bonsu  [address] [place] or [address], [place] [phone] Phone number for both [place] and [place]  locations is the same.  Urgent Medical and [facility] [address] [place] [phone]   [facility] [address], [place] or [address] [phone]   [facility] [address] [place] [phone], phone; [phone], fax Sees patients 1st and 3rd Saturday of every month.  Must not qualify for public or private insurance (i.e. Medicaid, Medicare, [place] Health Choice, Veterans' Benefits)  Household income should be no more than 200% of the poverty level The clinic cannot treat you if you are pregnant or think you are pregnant  Sexually transmitted diseases are not treated at the clinic.    Dental Care: Organization         Address  Phone  Notes  [facility] Department of [facility] [address] [place], [place] [phone] Accepts children up to age 46 who are enrolled in [place] or [place] Health Choice; pregnant women with a Medicaid card; and children who have applied for Medicaid or [place] Health Choice, but were declined, whose parents can pay a reduced fee at time of service.  [facility] of [facility]  [address], [place] [phone] Accepts children up to age 56 who are enrolled in [place] or [place] Health Choice; pregnant women with a Medicaid card; and children who have applied for Medicaid or [place] Health Choice, but were declined, whose parents can pay a reduced fee at time of service.  [facility]  [address] [place], [place] [phone] Patients are seen by appointment only. Walk-ins are not accepted. [facility] will see patients 19 years of age and older. Monday - Tuesday (8am-5pm) Most Wednesdays (8:30-5pm) $30 per visit, cash only  [facility] Adult Dental Access PROGRAM  [address], [facility] [phone] Patients are seen by appointment only. Walk-ins are not accepted. [facility]  will see patients 48 years of age and older. One Wednesday Evening (Monthly: Volunteer Based).  $30 per visit, cash only  [employer]  [phone] for adults; Children under  age 23, call Graduate Pediatric Dentistry at [phone]. Children aged 34-14, please call [phone] to request a pediatric application.  Dental services are provided in all areas of dental care including fillings, crowns and bridges, complete and partial dentures, implants, gum treatment, root canals, and extractions. Preventive care is also provided. Treatment is provided to both adults and children. Patients are selected via a lottery and there is often a waiting list.   [place] [facility] [address] [place]  [phone] [email or web address]   [facility] [address] [place], [place] [phone], Ext. [phone] Second and Fourth Thursday of each month, opens at 6:30 AM; Clinic ends at 9 AM.  Patients are seen on a first-come first-served basis, and a limited number are seen during each clinic.   [facility]  [address] Hillard Danker [place], [place] [phone]   Eligibility Requirements You must have lived in [place], [place], or [place] counties for at least the last three months.   You cannot be eligible for state or federal sponsored [employer], including [employer], [place], or [employer].   You generally cannot be eligible for healthcare insurance through your employer.    How to apply: Eligibility screenings are held every Tuesday and Wednesday afternoon from 1:00 pm until 4:00 pm. You do not need an appointment for the interview!  [facility] [address] [place], [place]   [place]  [place] Department  [place]  [phone]    Behavioral Health Resources in the Community: Intensive Outpatient  Programs Organization         Address  Phone  Notes  [place]. [address], [place], [place] [phone]   [facility] Outpatient [address] [place], [place]   ADS: Alcohol [facility] Drug Sv[facility] [address], [place], [place]   [place] [address]  [place], [place] or [phone]   Substance Abuse Resources Organization         Address  Phone  Notes  Al[facility] Drug Services  [phone]   [place]  [phone]   [facility] [place]   Chinita Pester  [phone]   Residential & Outpatient Substance Abuse Program  [phone]   Psychological Services Organization         Address  Phone  Notes  [facility]  [place]  [phone]   [place] [address] [place] or [phone]    Mobile Crisis Teams Organization         Address  Phone  Notes  [facility], Mobile Crisis Care Unit  [phone]   [facility]  [address] [place], [place]   [place] [address] [place] [phone]    Self-Help/Support Groups Organization         Address  Phone             Notes  [place]. of [place] - variety of support groups  [place] Call for more information  Narcotics Anonymous (NA), [facility] [address], [employer] [place]  2 meetings at this location   Brewing technologist  Notes  [facility] [phone] [place]  Mardene Speak [place]  [place]  [address] [place] [phone], [place], [place]   [place], [place] [phone] Admissions: 8am-3pm M-F  Incentives Substance [place] [address]    [place], [place]  [phone]   [facility] [address] [place], [place], [place]   [facility] [address],  [place], [place]   [facility] - Intensive Outpatient [address] Kristeen Mans 15, [place], [place]   [facility] [place].,  [place], [place] [phone] or [phone]   [facility] ([facility]) [address], [place], [place] Accepts Medicaid  [facility] [place] [address]  [place] [phone] Substance Abuse/Addiction Treatment   [facility] Organization         Address  Phone  Notes  [facility]  [phone]   Domenic Schwab, PhD [address] Arlis Porta [place], [place]   [phone] or [phone]   [place], [place] [phone]   [facility] [address] [place], [place] [phone] Insurance/Medicaid/sponsorship through [facility] and Families [address] [place]                                    [place], [place] [phone] [place] [address], [place] [phone]    Dr. Adele Schilder  [phone]   [facility] Dept. 1) [address] [place] 2) [place] 3)  [place] [address], [place] [phone]  [phone]   [place] [phone] or [phone] (After Hours)

## 2015-03-25 NOTE — ED Notes (Signed)
Here for 2 complaints.  R lower molar toothache ongoing for 2 weeks, took sisters oxycodone with temporary relief Saturday at 0700.   Also c/o insect bite Friday night to R medial/posterior upper arm. Redness with several whelps noted. C/o itching. Has applied benadryl cream only.   (denies: nvd, fever, dizziness, drainage, or sx other than pain and itching).

## 2015-03-25 NOTE — ED Provider Notes (Signed)
CSN: [id number]     Arrival date & time 03/24/15  2303 History  This chart was scribed for Cynthia Baton, MD by Budd Palmer, ED Scribe. This patient was seen in room MH09/MH09 and the patient's care was started at 12:47 AM.    Chief Complaint  Patient presents with  . Insect Bite   The history is provided by the patient. No language interpreter was used.   HPI Comments: Cynthia Petty is a 29 y.o. female who presents to the Emergency Department complaining of an itchy, painful insect bite to the right upper arm. She has applied benadryl cream with no relief. Pt denies exposure to new products. She denies associated fever.  Pt also complain of congestion and cough for the entirety of the summer. She denies a PMHx of asthma. She is not a smoker. Denies any fevers or other associated symptoms. Cough and stuffiness is worse in the morning.  Pt also c/o right lower dental pain onset 2 weeks ago. She has not seen her dentist for this. She rates her dental pain as a 10/10. She has taken an oxycodone at 7 AM with some relief.  Denies difficulty swallowing.  Past Medical History  Diagnosis Date  . Morbid obesity    History reviewed. No pertinent past surgical history. No family history on file. History  Substance Use Topics  . Smoking status: Never Smoker   . Smokeless tobacco: Never Used  . Alcohol Use: No   OB History    No data available     Review of Systems  Constitutional: Negative for fever.  HENT: Positive for congestion and dental problem.   Respiratory: Positive for cough. Negative for chest tightness and shortness of breath.   Cardiovascular: Negative for chest pain.  Gastrointestinal: Negative for abdominal pain.  Genitourinary: Negative for dysuria.  Skin: Positive for color change and rash.  Neurological: Negative for headaches.  All other systems reviewed and are negative.   Allergies  Review of patient's allergies indicates no known allergies.  Home  Medications   Prior to Admission medications   Medication Sig Start Date End Date Taking? Authorizing Provider  clindamycin (CLEOCIN) 150 MG capsule Take 2 capsules (300 mg total) by mouth 3 (three) times daily. 03/25/15   Cynthia Baton, MD  diphenhydrAMINE (BENADRYL) 25 mg capsule Take 1 capsule (25 mg total) by mouth every 8 (eight) hours as needed for itching. 03/25/15   Cynthia Baton, MD  famotidine (PEPCID) 20 MG tablet Take 1 tablet (20 mg total) by mouth daily. 03/25/15   Cynthia Baton, MD  furosemide (LASIX) 20 MG tablet Take 1 tablet (20 mg total) by mouth daily. 02/23/15   Tilden Fossa, MD  levonorgestrel (MIRENA) 20 MCG/24HR IUD 1 each by Intrauterine route once. Inserted in 2010    Historical Provider, MD  potassium chloride 20 MEQ TBCR Take 20 mEq by mouth daily. 02/23/15   Tilden Fossa, MD   BP 99/35 mmHg  Pulse 64  Temp(Src) 98.3 F (36.8 C) (Oral)  Resp 20  Ht 5\' 2"  (1.575 m)  Wt 250 lb (113.399 kg)  BMI 45.71 kg/m2  SpO2 94% Physical Exam  Constitutional: She is oriented to person, place, and time. She appears well-developed and well-nourished. No distress.  HENT:  Head: Normocephalic and atraumatic.  Mouth/Throat: Oropharynx is clear and moist. No oropharyngeal exudate.  No obvious dental abscess, generally poor dentition, patient able to lift tongue  Eyes: Pupils are equal, round, and reactive to light.  Neck: Neck supple.  Cardiovascular: Normal rate, regular rhythm and normal heart sounds.   Pulmonary/Chest: Effort normal and breath sounds normal. No respiratory distress. She has no wheezes.  Abdominal: Soft. Bowel sounds are normal. There is no tenderness. There is no rebound.  Musculoskeletal: She exhibits no edema.  Neurological: She is alert and oriented to person, place, and time.  Skin: Skin is warm and dry.  Urticaria mild erythema noted over the posterior aspect of the proximal right upper extremity, mild erythema extending proximally, urticaria  extend over the majority of the posterior aspect of the upper arm, there are superficial excoriations, no fluctuance  Psychiatric: She has a normal mood and affect.  Nursing note and vitals reviewed.   ED Course  Procedures  DIAGNOSTIC STUDIES: Oxygen Saturation is 94% on RA, low by my interpretation.    COORDINATION OF CARE: 12:51 AM - Discussed plans to order benadryl and antibiotics. Pt advised of plan for treatment and pt agrees.  Labs Review Labs Reviewed - No data to display  Imaging Review No results found.   EKG Interpretation None      MDM   Final diagnoses:  Urticaria  Insect bite  Cellulitis of right upper extremity  Pain, dental    Patient presents with multiple complaints.  Patient has evidence of a bite to her right upper arm with localized allergic response with urticaria. She also has superficial excoriations and possibly or early cellulitis. She's been afebrile. Discussed with patient using by mouth Benadryl and Pepcid for symptom control. She will also be placed on antibiotics. Given dental pain without evidence of abscess, will place on clindamycin which would cover for cellulitis and dental infection. She is otherwise nontoxic.  After history, exam, and medical workup I feel the patient has been appropriately medically screened and is safe for discharge home. Pertinent diagnoses were discussed with the patient. Patient was given return precautions.  I personally performed the services described in this documentation, which was scribed in my presence. The recorded information has been reviewed and is accurate.   Cynthia Baton, MD 03/25/15 [phone]

## 2015-07-07 ENCOUNTER — Encounter (HOSPITAL_BASED_OUTPATIENT_CLINIC_OR_DEPARTMENT_OTHER): Payer: Self-pay | Admitting: *Deleted

## 2015-07-07 ENCOUNTER — Emergency Department (HOSPITAL_BASED_OUTPATIENT_CLINIC_OR_DEPARTMENT_OTHER)
Admission: EM | Admit: 2015-07-07 | Discharge: 2015-07-07 | Disposition: A | Discharge status: Home / Self Care | Payer: Self-pay | Attending: Emergency Medicine | Admitting: Emergency Medicine

## 2015-07-07 DIAGNOSIS — Z792 Long term (current) use of antibiotics: Secondary | ICD-10-CM | POA: Insufficient documentation

## 2015-07-07 DIAGNOSIS — Z3202 Encounter for pregnancy test, result negative: Secondary | ICD-10-CM | POA: Insufficient documentation

## 2015-07-07 DIAGNOSIS — Z79899 Other long term (current) drug therapy: Secondary | ICD-10-CM | POA: Insufficient documentation

## 2015-07-07 DIAGNOSIS — N390 Urinary tract infection, site not specified: Secondary | ICD-10-CM | POA: Insufficient documentation

## 2015-07-07 DIAGNOSIS — Z79818 Long term (current) use of other agents affecting estrogen receptors and estrogen levels: Secondary | ICD-10-CM | POA: Insufficient documentation

## 2015-07-07 LAB — URINALYSIS, ROUTINE W REFLEX MICROSCOPIC
BILIRUBIN URINE: NEGATIVE
Glucose, UA: NEGATIVE mg/dL
Ketones, ur: 15 mg/dL — AB
NITRITE: POSITIVE — AB
PROTEIN: NEGATIVE mg/dL
Specific Gravity, Urine: 1.031 — ABNORMAL HIGH (ref 1.005–1.030)
UROBILINOGEN UA: 1 mg/dL (ref 0.0–1.0)
pH: 5.5 (ref 5.0–8.0)

## 2015-07-07 LAB — URINE MICROSCOPIC-ADD ON

## 2015-07-07 LAB — PREGNANCY, URINE: PREG TEST UR: NEGATIVE

## 2015-07-07 MED ORDER — CEPHALEXIN 500 MG PO CAPS: 500.0000 mg | ORAL_CAPSULE | Freq: Three times a day (TID) | ORAL | Status: DC

## 2015-07-07 NOTE — ED Provider Notes (Signed)
CSN: [id number]   Arrival date & time 07/07/15 1349  History  By signing my name below, I, Bethel BornBritney McCollum, attest that this documentation has been prepared under the direction and in the presence of Linwood DibblesJon Alexsis Branscom, MD. Electronically Signed: Bethel BornBritney McCollum, ED Scribe. 07/07/2015. 3:54 PM.  Chief Complaint  Patient presents with  . Urinary Frequency    HPI The history is provided by the patient. No language interpreter was used.   Gabriel EaringLaparish Farinas is a 29 y.o. female who presents to the Emergency Department complaining of new increased urinary frequency with onset 1.5 weeks ago. Associated symptoms include dysuria. Pt denies fever, back pain, abdominal pain, nausea, and vomiting. NKDA.  Past Medical History  Diagnosis Date  . Morbid obesity (HCC)     History reviewed. No pertinent past surgical history.  No family history on file.  Social History  Substance Use Topics  . Smoking status: Never Smoker   . Smokeless tobacco: Never Used  . Alcohol Use: No     Review of Systems  Constitutional: Negative for fever and chills.  Gastrointestinal: Negative for nausea, vomiting and abdominal pain.  Genitourinary: Positive for dysuria and frequency. Negative for vaginal discharge.  Musculoskeletal: Negative for back pain.   Home Medications   Prior to Admission medications   Medication Sig Start Date End Date Taking? Authorizing Provider  clindamycin (CLEOCIN) 150 MG capsule Take 2 capsules (300 mg total) by mouth 3 (three) times daily. 03/25/15   Shon Batonourtney F Horton, MD  diphenhydrAMINE (BENADRYL) 25 mg capsule Take 1 capsule (25 mg total) by mouth every 8 (eight) hours as needed for itching. 03/25/15   Shon Batonourtney F Horton, MD  famotidine (PEPCID) 20 MG tablet Take 1 tablet (20 mg total) by mouth daily. 03/25/15   Shon Batonourtney F Horton, MD  furosemide (LASIX) 20 MG tablet Take 1 tablet (20 mg total) by mouth daily. 02/23/15   Tilden FossaElizabeth Rees, MD  levonorgestrel (MIRENA) 20 MCG/24HR IUD 1 each by  Intrauterine route once. Inserted in 2010    Historical Provider, MD  potassium chloride 20 MEQ TBCR Take 20 mEq by mouth daily. 02/23/15   Tilden FossaElizabeth Rees, MD    Allergies  Review of patient's allergies indicates no known allergies.  Triage Vitals: BP 101/69 mmHg  Pulse 69  Temp(Src) 98.5 F (36.9 C) (Oral)  Resp 20  Ht 5\' 2"  (1.575 m)  SpO2 100%  Physical Exam  Constitutional: She appears well-developed and well-nourished. No distress.  HENT:  Head: Normocephalic and atraumatic.  Right Ear: External ear normal.  Left Ear: External ear normal.  Eyes: Conjunctivae are normal. Right eye exhibits no discharge. Left eye exhibits no discharge. No scleral icterus.  Neck: Neck supple. No tracheal deviation present.  Cardiovascular: Normal rate.   Pulmonary/Chest: Effort normal. No stridor. No respiratory distress.  Abdominal: Soft. She exhibits no distension. There is no tenderness. There is no rebound and no guarding.  No CVA ternderness  Musculoskeletal: She exhibits no edema.  Neurological: She is alert. Cranial nerve deficit: no gross deficits.  Skin: Skin is warm and dry. No rash noted.  Psychiatric: She has a normal mood and affect.  Nursing note and vitals reviewed.   ED Course  Procedures   DIAGNOSTIC STUDIES: Oxygen Saturation is 100% on RA, normal by my interpretation.    COORDINATION OF CARE: 3:54 PM Discussed treatment plan which includes lab work with pt at bedside and pt agreed to plan.  Labs Reviewed  URINALYSIS, ROUTINE W REFLEX MICROSCOPIC (NOT AT [facility]) -  Abnormal; Notable for the following:    APPearance CLOUDY (*)    Specific Gravity, Urine 1.031 (*)    Hgb urine dipstick SMALL (*)    Ketones, ur 15 (*)    Nitrite POSITIVE (*)    Leukocytes, UA SMALL (*)    All other components within normal limits  URINE MICROSCOPIC-ADD ON - Abnormal; Notable for the following:    Squamous Epithelial / LPF FEW (*)    Bacteria, UA MANY (*)    All other components  within normal limits  PREGNANCY, URINE    Imaging Review No results found.  I personally reviewed and evaluated these lab results as a part of my medical decision-making.    MDM   Final diagnoses:  UTI (lower urinary tract infection)   symptoms are consistent with a urinary tract infection. The urinalysis does show many bacteria positive nitrite and leukocyte esterase. Patient has no other systemic symptoms to suggest pyelonephritis. Discharge home with a prescription for Keflex. Follow up with the primary doctor next week if the symptoms do not resolve.  I personally performed the services described in this documentation, which was scribed in my presence.  The recorded information has been reviewed and is accurate.    Linwood Dibbles, MD 07/07/15 [phone]

## 2015-07-07 NOTE — ED Notes (Signed)
Frequent urination x 2 weeks- dysuria x 4 days

## 2015-07-07 NOTE — Discharge Instructions (Signed)

## 2015-07-10 LAB — URINE CULTURE

## 2015-07-11 ENCOUNTER — Telehealth (HOSPITAL_BASED_OUTPATIENT_CLINIC_OR_DEPARTMENT_OTHER): Payer: Self-pay | Admitting: Emergency Medicine

## 2015-07-11 NOTE — Telephone Encounter (Signed)
Post ED Visit - Positive Culture Follow-up  Culture report reviewed by antimicrobial stewardship pharmacist:  []  Enzo BiNathan Batchelder, Pharm.D. []  Celedonio MiyamotoJeremy Frens, Pharm.D., BCPS [x]  Garvin FilaMike Maccia, Pharm.D. []  Georgina PillionElizabeth Martin, Pharm.D., BCPS []  Underhill CenterMinh Pham, [address]Ph[address]D., BCPS, AAHIVP []  Estella HuskMichelle Turner, Pharm.D., BCPS, AAHIVP []  Tennis Mustassie Stewart, [address]arm[address] []  Sherle Poeob Vincent, [address]  Positive urine culture Klebsiella Treated with cephalexin, organism sensitive to the same and no further patient follow-up is required at this time.  Berle MullMiller, Heather Streeper 07/11/2015, 10:10 AM

## 2016-02-20 ENCOUNTER — Emergency Department (HOSPITAL_BASED_OUTPATIENT_CLINIC_OR_DEPARTMENT_OTHER)
Admission: EM | Admit: 2016-02-20 | Discharge: 2016-02-20 | Disposition: A | Discharge status: Home / Self Care | Payer: No Typology Code available for payment source | Attending: Emergency Medicine | Admitting: Emergency Medicine

## 2016-02-20 ENCOUNTER — Encounter (HOSPITAL_BASED_OUTPATIENT_CLINIC_OR_DEPARTMENT_OTHER): Payer: Self-pay | Admitting: *Deleted

## 2016-02-20 DIAGNOSIS — R1030 Lower abdominal pain, unspecified: Secondary | ICD-10-CM | POA: Insufficient documentation

## 2016-02-20 DIAGNOSIS — R102 Pelvic and perineal pain: Secondary | ICD-10-CM | POA: Insufficient documentation

## 2016-02-20 DIAGNOSIS — Z6841 Body Mass Index (BMI) 40.0 and over, adult: Secondary | ICD-10-CM | POA: Insufficient documentation

## 2016-02-20 DIAGNOSIS — Z711 Person with feared health complaint in whom no diagnosis is made: Secondary | ICD-10-CM

## 2016-02-20 LAB — CBC WITH DIFFERENTIAL/PLATELET
BASOS ABS: 0 10*3/uL (ref 0.0–0.1)
BASOS PCT: 0 %
EOS ABS: 0.1 10*3/uL (ref 0.0–0.7)
EOS PCT: 1 %
HCT: 39.1 % (ref 36.0–46.0)
Hemoglobin: 13.1 g/dL (ref 12.0–15.0)
Lymphocytes Relative: 37 %
Lymphs Abs: 2.9 10*3/uL (ref 0.7–4.0)
MCH: 31.3 pg (ref 26.0–34.0)
MCHC: 33.5 g/dL (ref 30.0–36.0)
MCV: 93.5 fL (ref 78.0–100.0)
MONO ABS: 0.5 10*3/uL (ref 0.1–1.0)
Monocytes Relative: 6 %
Neutro Abs: 4.3 10*3/uL (ref 1.7–7.7)
Neutrophils Relative %: 56 %
PLATELETS: 194 10*3/uL (ref 150–400)
RBC: 4.18 MIL/uL (ref 3.87–5.11)
RDW: 11.9 % (ref 11.5–15.5)
WBC: 7.8 10*3/uL (ref 4.0–10.5)

## 2016-02-20 LAB — BASIC METABOLIC PANEL
Anion gap: 7 (ref 5–15)
BUN: 15 mg/dL (ref 6–20)
CALCIUM: 9.9 mg/dL (ref 8.9–10.3)
CO2: 26 mmol/L (ref 22–32)
Chloride: 105 mmol/L (ref 101–111)
Creatinine, Ser: 0.84 mg/dL (ref 0.44–1.00)
GFR calc Af Amer: >60 mL/min (ref >60)
GLUCOSE: 88 mg/dL (ref 65–99)
Potassium: 3.7 mmol/L (ref 3.5–5.1)
SODIUM: 138 mmol/L (ref 135–145)

## 2016-02-20 LAB — URINALYSIS, ROUTINE W REFLEX MICROSCOPIC
BILIRUBIN URINE: NEGATIVE
GLUCOSE, UA: NEGATIVE mg/dL
Hgb urine dipstick: NEGATIVE
Ketones, ur: 15 mg/dL — AB
LEUKOCYTES UA: NEGATIVE
NITRITE: NEGATIVE
PH: 5.5 (ref 5.0–8.0)
Protein, ur: NEGATIVE mg/dL
SPECIFIC GRAVITY, URINE: 1.03 (ref 1.005–1.030)

## 2016-02-20 LAB — WET PREP, GENITAL
Clue Cells Wet Prep HPF POC: NONE SEEN
SPERM: NONE SEEN
TRICH WET PREP: NONE SEEN
YEAST WET PREP: NONE SEEN

## 2016-02-20 LAB — PREGNANCY, URINE: Preg Test, Ur: NEGATIVE

## 2016-02-20 MED ORDER — AZITHROMYCIN 250 MG PO TABS
1000.0000 mg | ORAL_TABLET | Freq: Once | ORAL | Status: AC
  Administered 2016-02-20: 1000 mg via ORAL
  Filled 2016-02-20: qty 4

## 2016-02-20 MED ORDER — CEFTRIAXONE SODIUM 250 MG IJ SOLR
250.0000 mg | Freq: Once | INTRAMUSCULAR | Status: AC
  Administered 2016-02-20: 250 mg via INTRAMUSCULAR
  Filled 2016-02-20: qty 250

## 2016-02-20 NOTE — ED Provider Notes (Signed)
CSN: [id number]     Arrival date & time 02/20/16  1628 History   First MD Initiated Contact with Patient 02/20/16 1807     Chief Complaint  Patient presents with  . Pelvic Pain    Cynthia Petty is a 30 y.o. female Who presents to the emergency Department complaining of intermittent lower abdominal pain and vaginal bleeding since placement of her Mirena IUD 6 weeks ago. Patient reports that her IUD was replaced by her OB/GYN Dr. Loreta AveWagner 6 weeks ago. She reports since then she's had intermittent lower abdominal pain and lower side pain. She denies any current abdominal pain. She also reports she had a heavy menstrual cycle for the first and days. She reports since she's had some intermittent spotting. She denies any current vaginal bleeding or discharge. She has not been able follow up with her OB/GYN because her insurance lapsed and she will not be able to get her new insurance until September. She has not followed up with her OB/GYN. She would like the IUD removed. Patient reports she is sexually active and uses protection. Patient denies fevers, vaginal discharge, vaginal bleeding, nausea, vomiting, diarrhea, rashes, urinary symptoms.   Patient is a 30 y.o. female presenting with pelvic pain. The history is provided by the patient. No language interpreter was used.  Pelvic Pain Associated symptoms include abdominal pain (resolved. intermittent. ). Pertinent negatives include no chest pain, chills, congestion, coughing, fever, headaches, nausea, neck pain, rash, sore throat or vomiting.    Past Medical History  Diagnosis Date  . Morbid obesity (HCC)    History reviewed. No pertinent past surgical history. History reviewed. No pertinent family history. Social History  Substance Use Topics  . Smoking status: Never Smoker   . Smokeless tobacco: Never Used  . Alcohol Use: No   OB History    No data available     Review of Systems  Constitutional: Negative for fever and chills.  HENT:  Negative for congestion and sore throat.   Eyes: Negative for visual disturbance.  Respiratory: Negative for cough and shortness of breath.   Cardiovascular: Negative for chest pain.  Gastrointestinal: Positive for abdominal pain (resolved. intermittent. ). Negative for nausea, vomiting and diarrhea.  Genitourinary: Positive for vaginal bleeding and pelvic pain. Negative for dysuria, urgency, frequency, flank pain, decreased urine volume, vaginal discharge, difficulty urinating and vaginal pain.  Musculoskeletal: Negative for back pain and neck pain.  Skin: Negative for rash.  Neurological: Negative for headaches.      Allergies  Review of patient's allergies indicates no known allergies.  Home Medications   Prior to Admission medications   Medication Sig Start Date End Date Taking? Authorizing Provider  diphenhydrAMINE (BENADRYL) 25 mg capsule Take 1 capsule (25 mg total) by mouth every 8 (eight) hours as needed for itching. 03/25/15   Shon Batonourtney F Horton, MD  famotidine (PEPCID) 20 MG tablet Take 1 tablet (20 mg total) by mouth daily. 03/25/15   Shon Batonourtney F Horton, MD  levonorgestrel (MIRENA) 20 MCG/24HR IUD 1 each by Intrauterine route once. Inserted in 2010    Historical Provider, MD  potassium chloride 20 MEQ TBCR Take 20 mEq by mouth daily. 02/23/15   Tilden FossaElizabeth Rees, MD   BP 124/81 mmHg  Pulse 66  Temp(Src) 98.2 F (36.8 C)  Resp 16  Ht 5\' 2"  (1.575 m)  Wt 108.863 kg  BMI 43.89 kg/m2  SpO2 100% Physical Exam  Constitutional: She appears well-developed and well-nourished. No distress.  Nontoxic appearing.  HENT:  Head: Normocephalic and atraumatic.  Mouth/Throat: Oropharynx is clear and moist.  Eyes: Conjunctivae are normal. Pupils are equal, round, and reactive to light. Right eye exhibits no discharge. Left eye exhibits no discharge.  Neck: Neck supple.  Cardiovascular: Normal rate, regular rhythm, normal heart sounds and intact distal pulses.   Pulmonary/Chest: Effort  normal and breath sounds normal. No respiratory distress. She has no wheezes. She has no rales.  Abdominal: Soft. Bowel sounds are normal. She exhibits no distension and no mass. There is no tenderness. There is no guarding.  Abdomen is soft and nontender to palpation. Bowel sounds are present. No CVA or flank tenderness.  Genitourinary:  Pelvic exam performed by me with female RN chaperone. No external lesions or rashes noted. Patient has mild white vaginal discharge on exam. Cervix appears slightly inflamed. IUD string visualized. No CMT. No adnexal tenderness or fullness. No vaginal bleeding.   Musculoskeletal: She exhibits no edema.  Lymphadenopathy:    She has no cervical adenopathy.  Neurological: She is alert. Coordination normal.  Skin: Skin is warm and dry. No rash noted. She is not diaphoretic. No erythema. No pallor.  Psychiatric: She has a normal mood and affect. Her behavior is normal.  Nursing note and vitals reviewed.   ED Course  Procedures (including critical care time) Labs Review Labs Reviewed  WET PREP, GENITAL - Abnormal; Notable for the following:    WBC, Wet Prep HPF POC MODERATE (*)    All other components within normal limits  URINALYSIS, ROUTINE W REFLEX MICROSCOPIC (NOT AT [facility]) - Abnormal; Notable for the following:    Ketones, ur 15 (*)    All other components within normal limits  PREGNANCY, URINE  BASIC METABOLIC PANEL  CBC WITH DIFFERENTIAL/PLATELET  GC/CHLAMYDIA PROBE AMP ([place]) NOT AT [facility]    Imaging Review No results found. I have personally reviewed and evaluated these images and lab results as part of my medical decision-making.   EKG Interpretation None      Filed Vitals:   02/20/16 1633 02/20/16 1933  BP: 107/74 124/81  Pulse: 80 66  Temp: 98.2 F (36.8 C)   Resp: 16 16  Height:  (1.575 m)   Weight: 108.863 kg   SpO2: 100% 100%     MDM   Meds given in ED:  Medications  cefTRIAXone (ROCEPHIN) injection 250 mg  (not administered)  azithromycin (ZITHROMAX) tablet 1,000 mg (not administered)    New Prescriptions   No medications on file    Final diagnoses:  Concern about STD in female without diagnosis   This is a 30 y.o. female Who presents to the emergency Department complaining of intermittent lower abdominal pain and vaginal bleeding since placement of her Mirena IUD 6 weeks ago. Patient reports that her IUD was replaced by her OB/GYN Dr. Loreta Ave 6 weeks ago. She reports since then she's had intermittent lower abdominal pain and lower side pain. She denies any current abdominal pain. She also reports she had a heavy menstrual cycle for the first and days. She reports since she's had some intermittent spotting. She denies any current vaginal bleeding or discharge. She has not been able follow up with her OB/GYN because her insurance lapsed and she will not be able to get her new insurance until September. She has not followed up with her OB/GYN.  On exam the patient is afebrile and nontoxic appearing. Her abdomen is soft and nontender palpation. On pelvic exam patient has some white vaginal discharge with  her cervix is slightly erythematous. This could be from cervicitis or from irritation of the placement of her IUD. She is no cervical motion tenderness. No adnexal tenderness or fullness. BMP and CBC are unremarkable. Wet prep returns with moderate white blood cells. Urinalysis is nitrite and leukocyte negative. Pregnancy test is negative. I explained to the patient that I felt uncomfortable removing her IUD here. She will need to follow up as outpatient clinic to remove her IUD. Will go ahead and treat with Rocephin and azithromycin for possible STD versus cervicitis. She does not have any cervical motion tenderness. I encouraged her to follow-up with woman[facility] outpatient clinic for recheck and possible removal of her IUD if she so desires. I advised a gonorrhea and chlamydia testing were pending and she should  follow-up in 3 days for the test results. I discussed return precautions. I advised the patient to follow-up with their primary care provider this week. I advised the patient to return to the emergency department with new or worsening symptoms or new concerns. The patient verbalized understanding and agreement with plan.       Everlene FarrierWilliam Avonell Lenig, PA-C 02/20/16 2014  Arby BarretteMarcy Pfeiffer, MD 02/23/16 1131

## 2016-02-20 NOTE — ED Notes (Signed)
Pt c/o pelvic pain with IUD. X 1 week

## 2016-02-20 NOTE — ED Notes (Signed)
Pt reports lower abdominal pain and bleeding since having Mirena placed in 4-6 weeks ago. Reports this is her 2nd mirena and she didn't have trouble with the first.

## 2016-02-20 NOTE — Discharge Instructions (Signed)
Cervicitis Cervicitis is a soreness and swelling (inflammation) of the cervix. Your cervix is located at the bottom of your uterus. It opens up to the vagina. CAUSES   Sexually transmitted infections (STIs).   Allergic reaction.   Medicines or birth control devices that are put in the vagina.   Injury to the cervix.   Bacterial infections.  RISK FACTORS You are at greater risk if you:  Have unprotected sexual intercourse.  Have sexual intercourse with many partners.  Began sexual intercourse at an early age.  Have a history of STIs. SYMPTOMS  There may be no symptoms. If symptoms occur, they may include:   Gray, white, yellow, or bad-smelling vaginal discharge.   Pain or itching of the area outside the vagina.   Painful sexual intercourse.   Lower abdominal or lower back pain, especially during intercourse.   Frequent urination.   Abnormal vaginal bleeding between periods, after sexual intercourse, or after menopause.   Pressure or a heavy feeling in the pelvis.  DIAGNOSIS  Diagnosis is made after a pelvic exam. Other tests may include:   Examination of any discharge under a microscope (wet prep).   A Pap test.  TREATMENT  Treatment will depend on the cause of cervicitis. If it is caused by an STI, both you and your partner will need to be treated. Antibiotic medicines will be given.  HOME CARE INSTRUCTIONS   Do not have sexual intercourse until your health care provider says it is okay.   Do not have sexual intercourse until your partner has been treated, if your cervicitis is caused by an STI.   Take your antibiotics as directed. Finish them even if you start to feel better.  SEEK MEDICAL CARE IF:  Your symptoms come back.   You have a fever.  MAKE SURE YOU:   Understand these instructions.  Will watch your condition.  Will get help right away if you are not doing well or get worse.   This information is not intended to replace  advice given to you by your health care provider. Make sure you discuss any questions you have with your health care provider.   Document Released: 08/11/2005 Document Revised: 08/16/2013 Document Reviewed: 02/02/2013 Elsevier Interactive Patient Education 2016 [employer] Intrauterine Device Information An intrauterine device (IUD) is inserted into your uterus to prevent pregnancy. There are two types of IUDs available:   Copper IUD--This type of IUD is wrapped in copper wire and is placed inside the uterus. Copper makes the uterus and fallopian tubes produce a fluid that kills sperm. The copper IUD can stay in place for 10 years.  Hormone IUD--This type of IUD contains the hormone progestin (synthetic progesterone). The hormone thickens the cervical mucus and prevents sperm from entering the uterus. It also thins the uterine lining to prevent implantation of a fertilized egg. The hormone can weaken or kill the sperm that get into the uterus. One type of hormone IUD can stay in place for 5 years, and another type can stay in place for 3 years. Your health care provider will make sure you are a good candidate for a contraceptive IUD. Discuss with your health care provider the possible side effects.  ADVANTAGES OF AN INTRAUTERINE DEVICE  IUDs are highly effective, reversible, long acting, and low maintenance.   There are no estrogen-related side effects.   An IUD can be used when breastfeeding.   IUDs are not associated with weight gain.   The copper IUD works immediately  after insertion.   The hormone IUD works right away if inserted within 7 days of your period starting. You will need to use a backup method of birth control for 7 days if the hormone IUD is inserted at any other time in your cycle.  The copper IUD does not interfere with your female hormones.   The hormone IUD can make heavy menstrual periods lighter and decrease cramping.   The hormone IUD can be used for 3 or  5 years.   The copper IUD can be used for 10 years. DISADVANTAGES OF AN INTRAUTERINE DEVICE  The hormone IUD can be associated with irregular bleeding patterns.   The copper IUD can make your menstrual flow heavier and more painful.   You may experience cramping and vaginal bleeding after insertion.    This information is not intended to replace advice given to you by your health care provider. Make sure you discuss any questions you have with your health care provider.   Document Released: 07/15/2004 Document Revised: 04/13/2013 Document Reviewed: 01/30/2013 Elsevier Interactive Patient Education [employer] Elsevier Inc.

## 2016-02-21 LAB — GC/CHLAMYDIA PROBE AMP (~~LOC~~) NOT AT ARMC
CHLAMYDIA, DNA PROBE: NEGATIVE
Neisseria Gonorrhea: NEGATIVE

## 2016-10-13 ENCOUNTER — Encounter (HOSPITAL_BASED_OUTPATIENT_CLINIC_OR_DEPARTMENT_OTHER): Payer: Self-pay | Admitting: Emergency Medicine

## 2016-10-13 DIAGNOSIS — L232 Allergic contact dermatitis due to cosmetics: Secondary | ICD-10-CM | POA: Insufficient documentation

## 2016-10-13 NOTE — ED Triage Notes (Signed)
Patient states that she has had a rash between her legs x 2 days

## 2016-10-14 ENCOUNTER — Emergency Department (HOSPITAL_BASED_OUTPATIENT_CLINIC_OR_DEPARTMENT_OTHER)
Admission: EM | Admit: 2016-10-14 | Discharge: 2016-10-14 | Disposition: A | Discharge status: Home / Self Care | Payer: Self-pay | Attending: Emergency Medicine | Admitting: Emergency Medicine

## 2016-10-14 DIAGNOSIS — L232 Allergic contact dermatitis due to cosmetics: Secondary | ICD-10-CM

## 2016-10-14 MED ORDER — LORATADINE 10 MG PO TABS
10.0000 mg | ORAL_TABLET | Freq: Once | ORAL | Status: AC
  Administered 2016-10-14: 10 mg via ORAL
  Filled 2016-10-14: qty 1

## 2016-10-14 MED ORDER — FAMOTIDINE 20 MG PO TABS: 20.0000 mg | ORAL_TABLET | Freq: Two times a day (BID) | ORAL | 0 refills | Status: AC

## 2016-10-14 MED ORDER — PREDNISONE 50 MG PO TABS
60.0000 mg | ORAL_TABLET | Freq: Once | ORAL | Status: AC
  Administered 2016-10-14: 60 mg via ORAL
  Filled 2016-10-14: qty 1

## 2016-10-14 MED ORDER — LORATADINE 10 MG PO TABS: 10.0000 mg | ORAL_TABLET | Freq: Every day | ORAL | 0 refills | Status: AC

## 2016-10-14 MED ORDER — FAMOTIDINE 20 MG PO TABS
20.0000 mg | ORAL_TABLET | Freq: Once | ORAL | Status: AC
  Administered 2016-10-14: 20 mg via ORAL
  Filled 2016-10-14: qty 1

## 2016-10-14 MED ORDER — PREDNISONE 20 MG PO TABS: ORAL_TABLET | ORAL | 0 refills | Status: DC

## 2016-10-14 NOTE — ED Provider Notes (Signed)
[facility]   CSN: [id number] Arrival date & time: 10/13/16  2217   By signing my name below, I, Rosario AdieWilliam Andrew Hiatt, attest that this documentation has been prepared under the direction and in the presence of Daveigh Batty, MD. Electronically Signed: Rosario AdieWilliam Andrew Hiatt, ED Scribe. 10/14/16. 12:36 AM.  History   Chief Complaint Chief Complaint  Patient presents with  . Rash   The history is provided by the patient. No language interpreter was used.  Rash   This is a new problem. The current episode started 2 days ago. The problem has been rapidly worsening. The problem is associated with a new detergent/soap. There has been no fever. The rash is present on the left upper leg and right upper leg. The patient is experiencing no pain. The pain has been constant since onset. Associated symptoms include itching. She has tried antihistamines and anti-itch cream for the symptoms. The treatment provided no relief.   HPI Comments: Cynthia Petty is a 31 y.o. female with a h/o obesity, who presents to the Emergency Department complaining of gradually spreading rash to the inner bilateral legs beginning two days ago. She has been applying Cortisone and Benadryl without relief. Pt notes that prior to the onset of her rash that she switched to a new soap. No new lotions, detergents, foods, animals, plants, or medications. She denies fever, or any other associated symptoms.   Past Medical History:  Diagnosis Date  . Morbid obesity (HCC)     There are no active problems to display for this patient.   History reviewed. No pertinent surgical history.  OB History    No data available       Home Medications    Prior to Admission medications   Medication Sig Start Date End Date Taking? Authorizing Provider  diphenhydrAMINE (BENADRYL) 25 mg capsule Take 1 capsule (25 mg total) by mouth every 8 (eight) hours as needed for itching. 03/25/15   Shon Batonourtney F Horton, MD    famotidine (PEPCID) 20 MG tablet Take 1 tablet (20 mg total) by mouth daily. 03/25/15   Shon Batonourtney F Horton, MD  levonorgestrel (MIRENA) 20 MCG/24HR IUD 1 each by Intrauterine route once. Inserted in 2010    Historical Provider, MD  potassium chloride 20 MEQ TBCR Take 20 mEq by mouth daily. 02/23/15   Tilden FossaElizabeth Rees, MD    Family History History reviewed. No pertinent family history.  Social History Social History  Substance Use Topics  . Smoking status: Never Smoker  . Smokeless tobacco: Never Used  . Alcohol use No     Allergies   Patient has no known allergies.   Review of Systems Review of Systems  Constitutional: Negative for fever.  HENT: Negative for congestion, drooling and facial swelling.   Respiratory: Negative for shortness of breath, wheezing and stridor.   Cardiovascular: Negative for chest pain.  Skin: Positive for itching and rash. Negative for wound.  All other systems reviewed and are negative.    Physical Exam Updated Vital Signs BP 108/66 (BP Location: Right Arm)   Pulse 77   Temp 98 F (36.7 C) (Oral)   Resp 18   Ht 5\' 2"  (1.575 m)   Wt 275 lb (124.7 kg)   LMP 09/25/2016   SpO2 100%   BMI 50.30 kg/m   Physical Exam  Constitutional: She appears well-developed and well-nourished.  HENT:  Head: Normocephalic.  Right Ear: External ear normal.  Left Ear: External ear normal.  Nose: Nose normal.  Mouth/Throat: Oropharynx is clear and moist. No oropharyngeal exudate.  Mallampati class II. No swelling of the tongues, lips, or uvula.   Eyes: Conjunctivae and EOM are normal. Pupils are equal, round, and reactive to light. Right eye exhibits no discharge. Left eye exhibits no discharge. No scleral icterus.  Neck: Normal range of motion. Neck supple. No JVD present. No tracheal deviation present.  Trachea is midline. No stridor or carotid bruits.   Cardiovascular: Normal rate, regular rhythm, normal heart sounds and intact distal pulses.   No murmur  heard. Pulmonary/Chest: Effort normal and breath sounds normal. No stridor. No respiratory distress. She has no wheezes. She has no rales.  Lungs CTA bilaterally. No stridor.   Abdominal: Soft. Bowel sounds are normal. She exhibits no distension. There is no tenderness. There is no rebound and no guarding.  Musculoskeletal: Normal range of motion. She exhibits no edema or tenderness.  All compartments are soft. No palpable cords.   Lymphadenopathy:    She has no cervical adenopathy.  Neurological: She is alert. She has normal reflexes. She displays normal reflexes. No cranial nerve deficit. She exhibits normal muscle tone. Coordination normal.  Skin: Skin is warm and dry. Capillary refill takes less than 2 seconds. Rash noted. No erythema. No pallor.  Papules on the proximal medial thighs bilaterally.  Psychiatric: She has a normal mood and affect. Her behavior is normal.  Nursing note and vitals reviewed.  ED Treatments / Results   Vitals:   10/13/16 2239 10/14/16 0048  BP: 108/66 97/56  Pulse: 77 68  Resp: 18 17  Temp: 98 F (36.7 C) 97.9 F (36.6 C)    DIAGNOSTIC STUDIES: Oxygen Saturation is 100% on RA, normal by my interpretation.   COORDINATION OF CARE: 12:35 AM-Discussed next steps with pt. Pt verbalized understanding and is agreeable with the plan.   Procedures Procedures (including critical care time)  Medications Ordered in ED  Medications  predniSONE (DELTASONE) tablet 60 mg (60 mg Oral Given 10/14/16 0044)  famotidine (PEPCID) tablet 20 mg (20 mg Oral Given 10/14/16 0044)  loratadine (CLARITIN) tablet 10 mg (10 mg Oral Given 10/14/16 0044)    Raised papules in the 5inch area of the inner thighs. Spares rest of body. No swelling of the lips, tongue, or uvula. Will d'c on Pepsid, Claritin, Prednisone. Advised to dicontinue use on new soap.   Final Clinical Impressions(s) / ED Diagnoses  Contact dermatitis: Based on history and exam patient has been appropriately  medically screened and emergency conditions excluded. Patient is stable for discharge at this time. Strict return precautions given for facial or tongue swelling shortness of breath oral lesions worsening rash or any problems or concerns  New Prescriptions New Prescriptions   FAMOTIDINE (PEPCID) 20 MG TABLET    Take 1 tablet (20 mg total) by mouth 2 (two) times daily.   LORATADINE (CLARITIN) 10 MG TABLET    Take 1 tablet (10 mg total) by mouth daily.   PREDNISONE (DELTASONE) 20 MG TABLET    3 tabs po day one, then 2 po daily x 4 days   I personally performed the services described in this documentation, which was scribed in my presence. The recorded information has been reviewed and is accurate.      Cy Blamer, MD 10/14/16 [phone]

## 2018-04-07 ENCOUNTER — Other Ambulatory Visit: Payer: Self-pay

## 2018-04-07 ENCOUNTER — Encounter (HOSPITAL_BASED_OUTPATIENT_CLINIC_OR_DEPARTMENT_OTHER): Payer: Self-pay

## 2018-04-07 ENCOUNTER — Emergency Department (HOSPITAL_BASED_OUTPATIENT_CLINIC_OR_DEPARTMENT_OTHER)
Admission: EM | Admit: 2018-04-07 | Discharge: 2018-04-07 | Disposition: A | Discharge status: Home / Self Care | Payer: PRIVATE HEALTH INSURANCE | Attending: Emergency Medicine | Admitting: Emergency Medicine

## 2018-04-07 ENCOUNTER — Emergency Department (HOSPITAL_BASED_OUTPATIENT_CLINIC_OR_DEPARTMENT_OTHER): Payer: PRIVATE HEALTH INSURANCE

## 2018-04-07 DIAGNOSIS — R0602 Shortness of breath: Secondary | ICD-10-CM | POA: Insufficient documentation

## 2018-04-07 DIAGNOSIS — Z79899 Other long term (current) drug therapy: Secondary | ICD-10-CM | POA: Diagnosis not present

## 2018-04-07 LAB — BASIC METABOLIC PANEL
Anion gap: 8 (ref 5–15)
BUN: 12 mg/dL (ref 6–20)
CO2: 25 mmol/L (ref 22–32)
CREATININE: 0.89 mg/dL (ref 0.44–1.00)
Calcium: 9.3 mg/dL (ref 8.9–10.3)
Chloride: 106 mmol/L (ref 98–111)
GFR calc Af Amer: >60 mL/min (ref >60)
Glucose, Bld: 98 mg/dL (ref 70–99)
Potassium: 3.8 mmol/L (ref 3.5–5.1)
SODIUM: 139 mmol/L (ref 135–145)

## 2018-04-07 LAB — CBC WITH DIFFERENTIAL/PLATELET
BASOS ABS: 0 10*3/uL (ref 0.0–0.1)
Basophils Relative: 0 %
EOS PCT: 2 %
Eosinophils Absolute: 0.1 10*3/uL (ref 0.0–0.7)
HCT: 36.5 % (ref 36.0–46.0)
Hemoglobin: 12.1 g/dL (ref 12.0–15.0)
LYMPHS PCT: 42 %
Lymphs Abs: 3.1 10*3/uL (ref 0.7–4.0)
MCH: 30.7 pg (ref 26.0–34.0)
MCHC: 33.2 g/dL (ref 30.0–36.0)
MCV: 92.6 fL (ref 78.0–100.0)
Monocytes Absolute: 0.4 10*3/uL (ref 0.1–1.0)
Monocytes Relative: 5 %
Neutro Abs: 3.8 10*3/uL (ref 1.7–7.7)
Neutrophils Relative %: 51 %
Platelets: 221 10*3/uL (ref 150–400)
RBC: 3.94 MIL/uL (ref 3.87–5.11)
RDW: 12.2 % (ref 11.5–15.5)
WBC: 7.4 10*3/uL (ref 4.0–10.5)

## 2018-04-07 LAB — PREGNANCY, URINE: PREG TEST UR: NEGATIVE

## 2018-04-07 LAB — TROPONIN I

## 2018-04-07 LAB — BRAIN NATRIURETIC PEPTIDE: B Natriuretic Peptide: 28.3 pg/mL (ref 0.0–100.0)

## 2018-04-07 LAB — D-DIMER, QUANTITATIVE: D-Dimer, Quant: 0.31 ug/mL-FEU (ref 0.00–0.50)

## 2018-04-07 MED ORDER — AZITHROMYCIN 250 MG PO TABS: 250.0000 mg | ORAL_TABLET | Freq: Every day | ORAL | 0 refills | Status: DC

## 2018-04-07 NOTE — Discharge Instructions (Addendum)
You were evaluated in emergency department for shortness of breath.  You had blood work chest x-ray and EKG and the only thing concerning that we found was that you have a possible early pneumonia.  You wanted to get started on some antibiotics and so we have called you in a prescription.  Please follow-up with your doctor and return if any worsening symptoms.

## 2018-04-07 NOTE — ED Provider Notes (Signed)
[facility] EMERGENCY DEPARTMENT Provider Note   CSN: [id number] Arrival date & time: 04/07/18  1501     History   Chief Complaint Chief Complaint  Patient presents with  . Shortness of Breath    HPI Cynthia Petty is a 32 y.o. female.  She presents to the emergency department complaining feeling more short of breath since yesterday.  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  There is no real pain but she says it just feels tight across the chest.  She said she had to walk a mile to go drop off some paperwork and took a lot out of her today.  She denies any fevers or chills no cough no sore throat no upper respiratory infection symptoms.  No belly pain no vomiting no diarrhea.  She feels her weights have been stable.  She is not on any medication other than some vitamins.  The history is provided by the patient.  Shortness of Breath  This is a new problem. The average episode lasts 2 days. The current episode started yesterday. The problem has not changed since onset.Associated symptoms include chest pain. Pertinent negatives include no fever, no headaches, no coryza, no rhinorrhea, no sore throat, no cough, no sputum production, no hemoptysis, no wheezing, no vomiting, no abdominal pain, no rash, no leg pain and no leg swelling. It is unknown what precipitated the problem. She has tried nothing for the symptoms. The treatment provided no relief. She has had no prior hospitalizations. She has had no prior ED visits.    Past Medical History:  Diagnosis Date  . Morbid obesity (HCC)     There are no active problems to display for this patient.   Past Surgical History:  Procedure Laterality Date  . CESAREAN SECTION       OB History   None      Home Medications    Prior to Admission medications   Medication Sig Start Date End Date Taking? Authorizing Provider  diphenhydrAMINE (BENADRYL) 25 mg capsule Take  1 capsule (25 mg total) by mouth every 8 (eight) hours as needed for itching. 03/25/15   Horton, Mayer Maskerourtney F, MD  famotidine (PEPCID) 20 MG tablet Take 1 tablet (20 mg total) by mouth 2 (two) times daily. 10/14/16   Palumbo, April, MD  levonorgestrel (MIRENA) 20 MCG/24HR IUD 1 each by Intrauterine route once. Inserted in 2010    [provider]  loratadine (CLARITIN) 10 MG tablet Take 1 tablet (10 mg total) by mouth daily. 10/14/16   Palumbo, April, MD  potassium chloride 20 MEQ TBCR Take 20 mEq by mouth daily. 02/23/15   Tilden Fossaees, Elizabeth, MD  predniSONE (DELTASONE) 20 MG tablet 3 tabs po day one, then 2 po daily x 4 days 10/14/16   Nicanor AlconPalumbo, April, MD    Family History No family history on file.  Social History Social History   Tobacco Use  . Smoking status: Never Smoker  . Smokeless tobacco: Never Used  Substance Use Topics  . Alcohol use: No  . Drug use: No     Allergies   Patient has no known allergies.   Review of Systems Review of Systems  Constitutional: Negative for fever.  HENT: Negative for rhinorrhea and sore throat.   Eyes: Negative for visual disturbance.  Respiratory: Positive for shortness of breath. Negative for cough, hemoptysis, sputum production and wheezing.   Cardiovascular: Positive for chest pain. Negative  for leg swelling.  Gastrointestinal: Negative for abdominal pain and vomiting.  Genitourinary: Negative for dysuria.  Musculoskeletal: Negative for joint swelling.  Skin: Negative for rash.  Neurological: Negative for headaches.     Physical Exam Updated Vital Signs BP 112/78 (BP Location: Left Arm)   Pulse 67   Temp 98.2 F (36.8 C) (Oral)   Resp 18   Ht 5\' 2"  (1.575 m)   Wt 127.9 kg   LMP 04/06/2018 (Exact Date)   SpO2 99%   BMI 51.58 kg/m   Physical Exam  Constitutional: She appears well-developed and well-nourished. No distress.  HENT:  Head: Normocephalic and atraumatic.  Mouth/Throat: Oropharynx is clear and moist.  Eyes:  Pupils are equal, round, and reactive to light. Conjunctivae and EOM are normal.  Neck: Neck supple.  Cardiovascular: Normal rate and regular rhythm.  No murmur heard. Pulmonary/Chest: Effort normal and breath sounds normal. No respiratory distress.  Abdominal: Soft. There is no tenderness.  Musculoskeletal: Normal range of motion. She exhibits no edema.       Right lower leg: She exhibits no tenderness.       Left lower leg: She exhibits no tenderness.  Neurological: She is alert.  Skin: Skin is warm and dry. Capillary refill takes less than 2 seconds.  Psychiatric: She has a normal mood and affect.  Nursing note and vitals reviewed.    ED Treatments / Results  Labs (all labs ordered are listed, but only abnormal results are displayed) Labs Reviewed  BASIC METABOLIC PANEL  TROPONIN I  CBC WITH DIFFERENTIAL/PLATELET  BRAIN NATRIURETIC PEPTIDE  D-DIMER, QUANTITATIVE (NOT AT [facility])  PREGNANCY, URINE    EKG EKG Interpretation  Date/Time:  Wednesday April 07 2018 15:30:26 EDT Ventricular Rate:  63 PR Interval:    QRS Duration: 95 QT Interval:  377 QTC Calculation: 386 R Axis:   59 Text Interpretation:  Sinus rhythm Borderline Q waves in inferior leads Baseline wander in lead(s) V6 similar to prior 1/14 Confirmed by Meridee Score [phone]) on 04/07/2018 3:34:24 PM   Radiology Dg Chest 2 View  Result Date: 04/07/2018 CLINICAL DATA:  Initial evaluation for acute shortness of breath. EXAM: CHEST - 2 VIEW COMPARISON:  Prior radiograph from 11/05/2014. FINDINGS: The cardiac and mediastinal silhouettes are stable in size and contour, and remain within normal limits. The lungs are normally inflated. Subtle peribronchial thickening with prominence of the bronchovascular markings at the right lung base noted. Mild no airspace consolidation, pleural effusion, or pulmonary edema is identified. There is no pneumothorax. No acute osseous abnormality identified. IMPRESSION: Subtle  peribronchial thickening at the right lung base, age indeterminate, but could reflect mild bronchiolitis and/or sequelae of early/developing infection. Electronically Signed   By: Rise Mu M.D.   On: 04/07/2018 15:55    Procedures Procedures (including critical care time)  Medications Ordered in ED Medications - No data to display   Initial Impression / Assessment and Plan / ED Course  I have reviewed the triage vital signs and the nursing notes.  Pertinent labs & imaging results that were available during my care of the patient were reviewed by me and considered in my medical decision making (see chart for details).  Clinical Course as of Apr 09 1333  Wed Apr 07, 2018  1623 Patient's work-up here has been unremarkable with normal d-dimer normal BNP normal troponin normal hematocrit.  Her chest x-ray was mentioning a possible early pneumonia and the patient herself said she would like to get started on something  in case this is a real thing.   [MB]    Clinical Course User Index [MB] Terrilee FilesButler, [place] Espina C, MD     Final Clinical Impressions(s) / ED Diagnoses   Final diagnoses:  SOB (shortness of breath)    ED Discharge Orders         Ordered    azithromycin (ZITHROMAX) 250 MG tablet  Daily     04/07/18 1624           Terrilee FilesButler, [place] Valcarcel C, MD 04/09/18 1335

## 2018-04-07 NOTE — ED Notes (Signed)
ED Provider at bedside. 

## 2018-04-07 NOTE — ED Triage Notes (Signed)
Pt c/o ShOB that started this AM. Pt speaking in complete sentences, NAD in triage room.

## 2020-02-02 ENCOUNTER — Encounter (HOSPITAL_BASED_OUTPATIENT_CLINIC_OR_DEPARTMENT_OTHER): Payer: Self-pay | Admitting: *Deleted

## 2020-02-02 ENCOUNTER — Emergency Department (HOSPITAL_BASED_OUTPATIENT_CLINIC_OR_DEPARTMENT_OTHER): Payer: PRIVATE HEALTH INSURANCE

## 2020-02-02 ENCOUNTER — Emergency Department (HOSPITAL_BASED_OUTPATIENT_CLINIC_OR_DEPARTMENT_OTHER)
Admission: EM | Admit: 2020-02-02 | Discharge: 2020-02-02 | Disposition: A | Discharge status: Home / Self Care | Payer: PRIVATE HEALTH INSURANCE | Attending: Emergency Medicine | Admitting: Emergency Medicine

## 2020-02-02 ENCOUNTER — Other Ambulatory Visit: Payer: Self-pay

## 2020-02-02 DIAGNOSIS — S99912A Unspecified injury of left ankle, initial encounter: Secondary | ICD-10-CM | POA: Diagnosis present

## 2020-02-02 DIAGNOSIS — Y999 Unspecified external cause status: Secondary | ICD-10-CM | POA: Insufficient documentation

## 2020-02-02 DIAGNOSIS — Y939 Activity, unspecified: Secondary | ICD-10-CM | POA: Insufficient documentation

## 2020-02-02 DIAGNOSIS — Y929 Unspecified place or not applicable: Secondary | ICD-10-CM | POA: Diagnosis not present

## 2020-02-02 DIAGNOSIS — S93402A Sprain of unspecified ligament of left ankle, initial encounter: Secondary | ICD-10-CM | POA: Insufficient documentation

## 2020-02-02 DIAGNOSIS — X58XXXA Exposure to other specified factors, initial encounter: Secondary | ICD-10-CM | POA: Diagnosis not present

## 2020-02-02 MED ORDER — IBUPROFEN 800 MG PO TABS
800.0000 mg | ORAL_TABLET | Freq: Once | ORAL | Status: AC
  Administered 2020-02-02: 800 mg via ORAL
  Filled 2020-02-02: qty 1

## 2020-02-02 NOTE — ED Triage Notes (Signed)
Pt states she awoke at 4am with left ankle pain, no memory of injury or trauma. Brace in place at triage, states "I had this so I thought I'd try it."

## 2020-02-02 NOTE — ED Notes (Signed)
Patient transported to X-ray 

## 2020-02-02 NOTE — ED Notes (Signed)
Pt discharged to home. Discharge instructions have been discussed with patient and/or family members. Pt verbally acknowledges understanding d/c instructions, and endorses comprehension to checkout at registration before leaving.  °

## 2020-02-02 NOTE — ED Provider Notes (Signed)
[facility] EMERGENCY DEPARTMENT Provider Note   CSN: [id number] Arrival date & time: 02/02/20  1525     History Chief Complaint  Patient presents with  . Ankle Pain    Kewanna Kasprzak is a 34 y.o. female.  HPI Patient is a 34 year old female with a past medical history significant for morbid obesity presented today with left ankle pain that began at 4 AM yesterday morning.  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 Patient denies any other injuries.  She denies any known trauma, has not had any significant overuse injuries in the past.  No history of osteopenia or osteoporosis.  She denies any abrasions or lacerations to the area.  She states she is able to walk but states it is painful.  She describes the pain is achy, constant, worse with movement.  Is completely gone when she rests and ices and elevates it.  She has not taken any medication for pain.    Past Medical History:  Diagnosis Date  . Morbid obesity (HCC)     There are no problems to display for this patient.   Past Surgical History:  Procedure Laterality Date  . CESAREAN SECTION       OB History   No obstetric history on file.     History reviewed. No pertinent family history.  Social History   Tobacco Use  . Smoking status: Never Smoker  . Smokeless tobacco: Never Used  Vaping Use  . Vaping Use: Never used  Substance Use Topics  . Alcohol use: No  . Drug use: No    Home Medications Prior to Admission medications   Medication Sig Start Date End Date Taking? Authorizing Provider  azithromycin (ZITHROMAX) 250 MG tablet Take 1 tablet (250 mg total) by mouth daily. Take first 2 tablets together, then 1 every day until finished. 04/07/18   Terrilee Files, MD  diphenhydrAMINE (BENADRYL) 25 mg capsule Take 1 capsule (25 mg total) by mouth every 8 (eight) hours as needed for  itching. 03/25/15   Horton, Mayer Masker, MD  famotidine (PEPCID) 20 MG tablet Take 1 tablet (20 mg total) by mouth 2 (two) times daily. 10/14/16   Palumbo, April, MD  levonorgestrel (MIRENA) 20 MCG/24HR IUD 1 each by Intrauterine route once. Inserted in 2010    [provider]  loratadine (CLARITIN) 10 MG tablet Take 1 tablet (10 mg total) by mouth daily. 10/14/16   Palumbo, April, MD  potassium chloride 20 MEQ TBCR Take 20 mEq by mouth daily. 02/23/15   Tilden Fossa, MD  predniSONE (DELTASONE) 20 MG tablet 3 tabs po day one, then 2 po daily x 4 days 10/14/16   Nicanor Alcon, April, MD    Allergies    Patient has no known allergies.  Review of Systems   Review of Systems  Constitutional: Negative for fever.  HENT: Negative for congestion.   Respiratory: Negative for shortness of breath.   Cardiovascular: Negative for chest pain.  Gastrointestinal: Negative for abdominal distention.  Musculoskeletal:       Left ankle pain  Neurological: Negative for dizziness and headaches.    Physical Exam Updated Vital Signs BP (!) 110/49 (BP Location: Right Arm)   Pulse 78   Temp 98.4 F (36.9 C) (Oral)   Resp 18   Ht 5\' 3"  (1.6 m)   Wt 131.1 kg   LMP  01/25/2020   SpO2 100%   BMI 51.19 kg/m   Physical Exam Vitals and nursing note reviewed.  Constitutional:      General: She is not in acute distress.    Appearance: Normal appearance. She is not ill-appearing.  HENT:     Head: Normocephalic and atraumatic.  Eyes:     General: No scleral icterus.       Right eye: No discharge.        Left eye: No discharge.     Conjunctiva/sclera: Conjunctivae normal.  Cardiovascular:     Comments: DP and PT pulses 3+ and symmetric bilaterally Pulmonary:     Effort: Pulmonary effort is normal.     Breath sounds: No stridor.  Musculoskeletal:     Comments: Full range of motion of ankle.  Mild swelling present.  No deformity, bruising or erythema or cellulitis.  There is tenderness to palpation of  the medial malleolus.  No posterior or lateral malleolus tenderness to palpation.  Skin:    Capillary Refill: Capillary refill takes less than 2 seconds.  Neurological:     Mental Status: She is alert and oriented to person, place, and time. Mental status is at baseline.     Comments: Sensation intact distally     ED Results / Procedures / Treatments   Labs (all labs ordered are listed, but only abnormal results are displayed) Labs Reviewed - No data to display  EKG None  Radiology DG Ankle Complete Left  Result Date: 02/02/2020 CLINICAL DATA:  Left ankle pain and swelling, inability to bear weight EXAM: LEFT ANKLE COMPLETE - 3+ VIEW COMPARISON:  None. FINDINGS: Frontal, oblique, lateral views of the left ankle demonstrate no fractures. Alignment is anatomic. Joint spaces are well preserved. Diffuse soft tissue edema. IMPRESSION: 1. Diffuse soft tissue edema.  No acute bony abnormality. Electronically Signed   By: Randa Ngo M.D.   On: 02/02/2020 17:11    Procedures Procedures (including critical care time)  Medications Ordered in ED Medications  ibuprofen (ADVIL) tablet 800 mg (800 mg Oral Given 02/02/20 1709)    ED Course  I have reviewed the triage vital signs and the nursing notes.  Pertinent labs & imaging results that were available during my care of the patient were reviewed by me and considered in my medical decision making (see chart for details).    MDM Rules/Calculators/A&P                          Patient is 34 year old female presented today with left ankle pain as described above.  Physical exam notable for medial malleolus tenderness to palpation.  Otherwise very reassuring exam however there is some scant swelling.  Imaging obtained per [place] ankle rules.  X-ray negative for fracture.  There is swelling.  Suspect sprain or strain.  Will provide patient with a cam walker.  Offered crutches however she declined.  Patient ambulatory with Cam walker.   Discharged with close follow-up with PCP and orthopedics if she would like to follow-up with them.  Final Clinical Impression(s) / ED Diagnoses Final diagnoses:  Sprain of left ankle, unspecified ligament, initial encounter    Rx / DC Orders ED Discharge Orders    None       Tedd Sias, [place] 02/02/20 1719    Margette Fast, MD 02/02/20 1728

## 2020-02-02 NOTE — Discharge Instructions (Signed)
Your ankle x-ray was without any fracture.  I suspect this is a strain or a sprain.  Please rest ice and elevate.  Use the ankle brace/walking boot that I provided you for. Please use Tylenol or ibuprofen for pain.  You may use 600 mg ibuprofen every 6 hours or 1000 mg of Tylenol every 6 hours.  You may choose to alternate between the 2.  This would be most effective.  Not to exceed 4 g of Tylenol within 24 hours.  Not to exceed 3200 mg ibuprofen 24 hours.

## 2020-08-05 ENCOUNTER — Encounter (HOSPITAL_BASED_OUTPATIENT_CLINIC_OR_DEPARTMENT_OTHER): Payer: Self-pay | Admitting: *Deleted

## 2020-08-05 ENCOUNTER — Other Ambulatory Visit: Payer: Self-pay

## 2020-08-05 ENCOUNTER — Emergency Department (HOSPITAL_BASED_OUTPATIENT_CLINIC_OR_DEPARTMENT_OTHER)
Admission: EM | Admit: 2020-08-05 | Discharge: 2020-08-05 | Disposition: A | Discharge status: Home / Self Care | Payer: PRIVATE HEALTH INSURANCE | Attending: Emergency Medicine | Admitting: Emergency Medicine

## 2020-08-05 DIAGNOSIS — T7840XA Allergy, unspecified, initial encounter: Secondary | ICD-10-CM | POA: Diagnosis not present

## 2020-08-05 DIAGNOSIS — S40862A Insect bite (nonvenomous) of left upper arm, initial encounter: Secondary | ICD-10-CM | POA: Insufficient documentation

## 2020-08-05 DIAGNOSIS — W57XXXA Bitten or stung by nonvenomous insect and other nonvenomous arthropods, initial encounter: Secondary | ICD-10-CM | POA: Insufficient documentation

## 2020-08-05 MED ORDER — DIPHENHYDRAMINE HCL 25 MG PO CAPS
25.0000 mg | ORAL_CAPSULE | Freq: Once | ORAL | Status: AC
  Administered 2020-08-05: 25 mg via ORAL
  Filled 2020-08-05: qty 1

## 2020-08-05 NOTE — ED Triage Notes (Signed)
Pt has ?spider/insect bite to back of left arm x 1 day. Reports itching. Large circle of erythema noted and marked in triage

## 2020-08-05 NOTE — Discharge Instructions (Addendum)
You were seen today for an insect bite of the left upper arm.  It does appear that you likely were bitten by something and are having a local allergic reaction.  Take Benadryl as needed.  If you begin to hurt, redness extends beyond the marked area, or you develop fevers or systemic symptoms, you should be reevaluated

## 2020-08-05 NOTE — ED Provider Notes (Signed)
[facility] EMERGENCY DEPARTMENT Provider Note   CSN: [id number] Arrival date & time: 08/05/20  2221     History Chief Complaint  Patient presents with  . Insect Bite    Cynthia Petty is a 34 y.o. female.  HPI     This is a 34 year old female with history of morbid obesity who presents with left arm itching.  Patient believes that she was bitten by an insect.  She is not sure what.  She has had significant itching to the arm since that time.  She has not noted any fevers.  She has noted some slight redness.  No pain.  No fevers or systemic symptoms  Past Medical History:  Diagnosis Date  . Morbid obesity (HCC)     There are no problems to display for this patient.   Past Surgical History:  Procedure Laterality Date  . CESAREAN SECTION       OB History   No obstetric history on file.     No family history on file.  Social History   Tobacco Use  . Smoking status: Never Smoker  . Smokeless tobacco: Never Used  Vaping Use  . Vaping Use: Never used  Substance Use Topics  . Alcohol use: No  . Drug use: No    Home Medications Prior to Admission medications   Medication Sig Start Date End Date Taking? Authorizing Provider  azithromycin (ZITHROMAX) 250 MG tablet Take 1 tablet (250 mg total) by mouth daily. Take first 2 tablets together, then 1 every day until finished. 04/07/18   Terrilee Files, MD  diphenhydrAMINE (BENADRYL) 25 mg capsule Take 1 capsule (25 mg total) by mouth every 8 (eight) hours as needed for itching. 03/25/15   Jahni Paul, Mayer Masker, MD  famotidine (PEPCID) 20 MG tablet Take 1 tablet (20 mg total) by mouth 2 (two) times daily. 10/14/16   Palumbo, April, MD  levonorgestrel (MIRENA) 20 MCG/24HR IUD 1 each by Intrauterine route once. Inserted in 2010    [provider]  loratadine (CLARITIN) 10 MG tablet Take 1 tablet (10 mg total) by mouth daily. 10/14/16   Palumbo, April, MD  potassium chloride 20 MEQ TBCR Take 20 mEq by mouth  daily. 02/23/15   Tilden Fossa, MD  predniSONE (DELTASONE) 20 MG tablet 3 tabs po day one, then 2 po daily x 4 days 10/14/16   Nicanor Alcon, April, MD    Allergies    Patient has no known allergies.  Review of Systems   Review of Systems  Constitutional: Negative for fever.  Skin: Positive for color change. Negative for wound.  All other systems reviewed and are negative.   Physical Exam Updated Vital Signs BP 110/85 (BP Location: Left Wrist)   Pulse 65   Temp 98.1 F (36.7 C) (Oral)   Resp 16   Ht 1.6 m (5\' 3" )   Wt 131.1 kg   LMP 08/03/2020   SpO2 100%   BMI 51.19 kg/m   Physical Exam Vitals and nursing note reviewed.  Constitutional:      Appearance: She is well-developed and well-nourished. She is obese. She is not ill-appearing.  HENT:     Head: Normocephalic and atraumatic.     Mouth/Throat:     Mouth: Mucous membranes are moist.  Eyes:     Pupils: Pupils are equal, round, and reactive to light.  Cardiovascular:     Rate and Rhythm: Normal rate and regular rhythm.  Pulmonary:     Effort: Pulmonary effort is  normal. No respiratory distress.  Musculoskeletal:        General: No tenderness.     Cervical back: Neck supple.  Skin:    General: Skin is warm and dry.     Comments: Well-circumscribed patchy erythema of the posterior left upper arm, no fluctuance or induration, punctate bite noted centrally without eschar or ulceration  Neurological:     Mental Status: She is alert and oriented to person, place, and time.  Psychiatric:        Mood and Affect: Mood and affect and mood normal.     ED Results / Procedures / Treatments   Labs (all labs ordered are listed, but only abnormal results are displayed) Labs Reviewed - No data to display  EKG None  Radiology No results found.  Procedures Procedures (including critical care time)  Medications Ordered in ED Medications  diphenhydrAMINE (BENADRYL) capsule 25 mg (has no administration in time range)     ED Course  I have reviewed the triage vital signs and the nursing notes.  Pertinent labs & imaging results that were available during my care of the patient were reviewed by me and considered in my medical decision making (see chart for details).    MDM Rules/Calculators/A&P                          Patient presents with an itchy lesion to the left upper arm suspicious for insect bite.  She is overall nontoxic and vital signs are reassuring.  She has a very well circumscribed area of slight erythema to the left upper arm.  Physical exam given what appears to be a small bite centrally is most consistent with a local allergic reaction.  I do not see any current signs or symptoms of infection or abscess.  She is not taking anything for her itching.  Recommend Benadryl.  Area was marked.  She was given return precautions if she were to develop infectious symptoms.  After history, exam, and medical workup I feel the patient has been appropriately medically screened and is safe for discharge home. Pertinent diagnoses were discussed with the patient. Patient was given return precautions.  Final Clinical Impression(s) / ED Diagnoses Final diagnoses:  Insect bite of left upper arm, initial encounter  Allergic reaction, initial encounter    Rx / DC Orders ED Discharge Orders    None       Lamanda Rudder, Mayer Masker, MD 08/05/20 [phone]

## 2020-08-20 IMAGING — DX DG ANKLE COMPLETE 3+V*L*
3 series · 3 of 3 positions shown · non-contrast
Comparison: None.

CLINICAL DATA: Left ankle pain and swelling, inability to bear
weight

EXAM:
LEFT ANKLE COMPLETE - 3+ VIEW

[ankle ap]
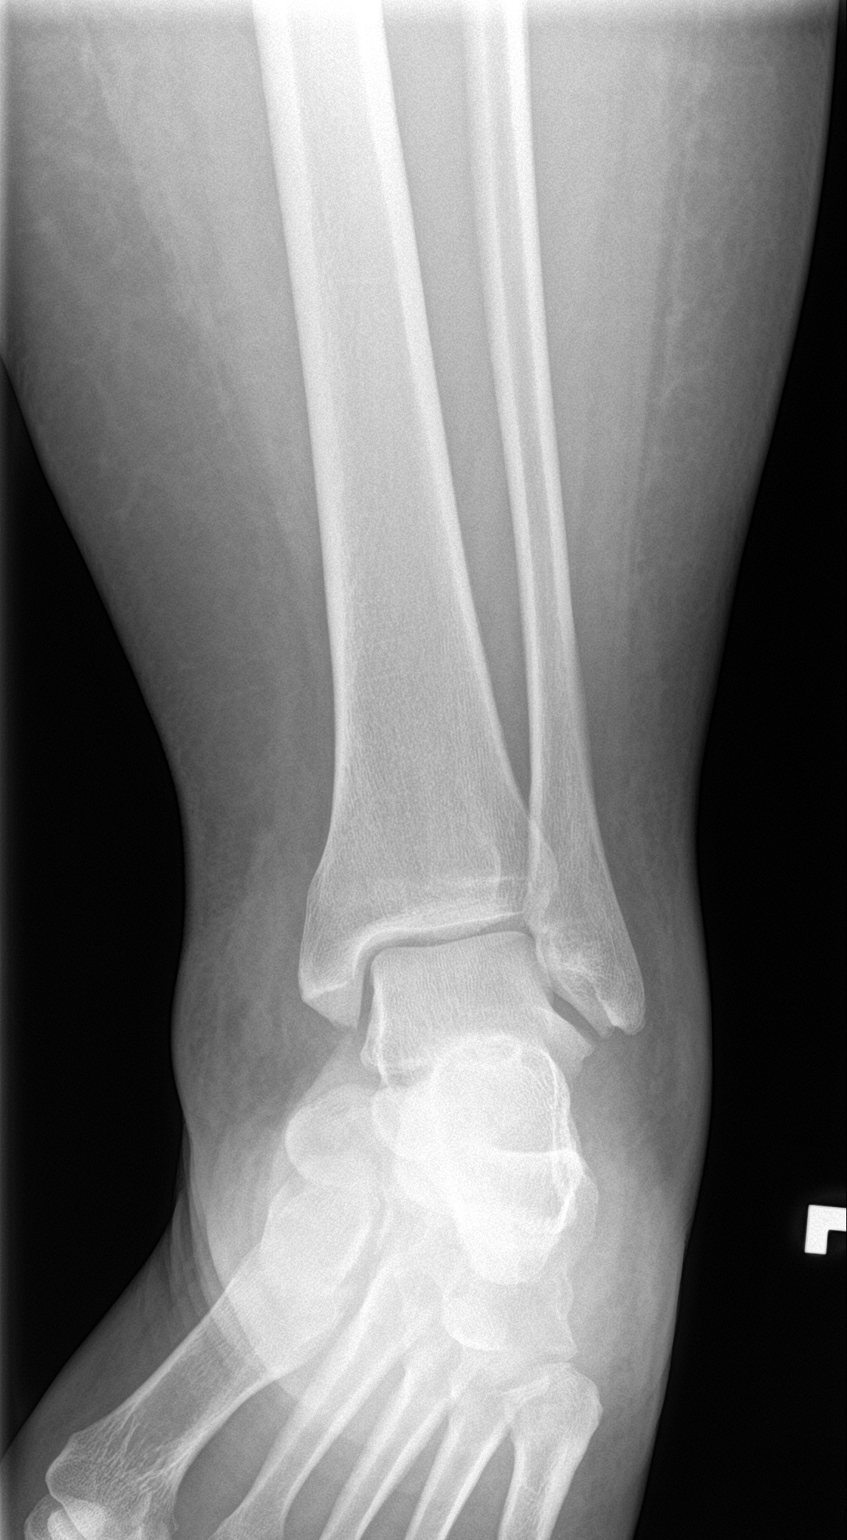

[ankle obl]
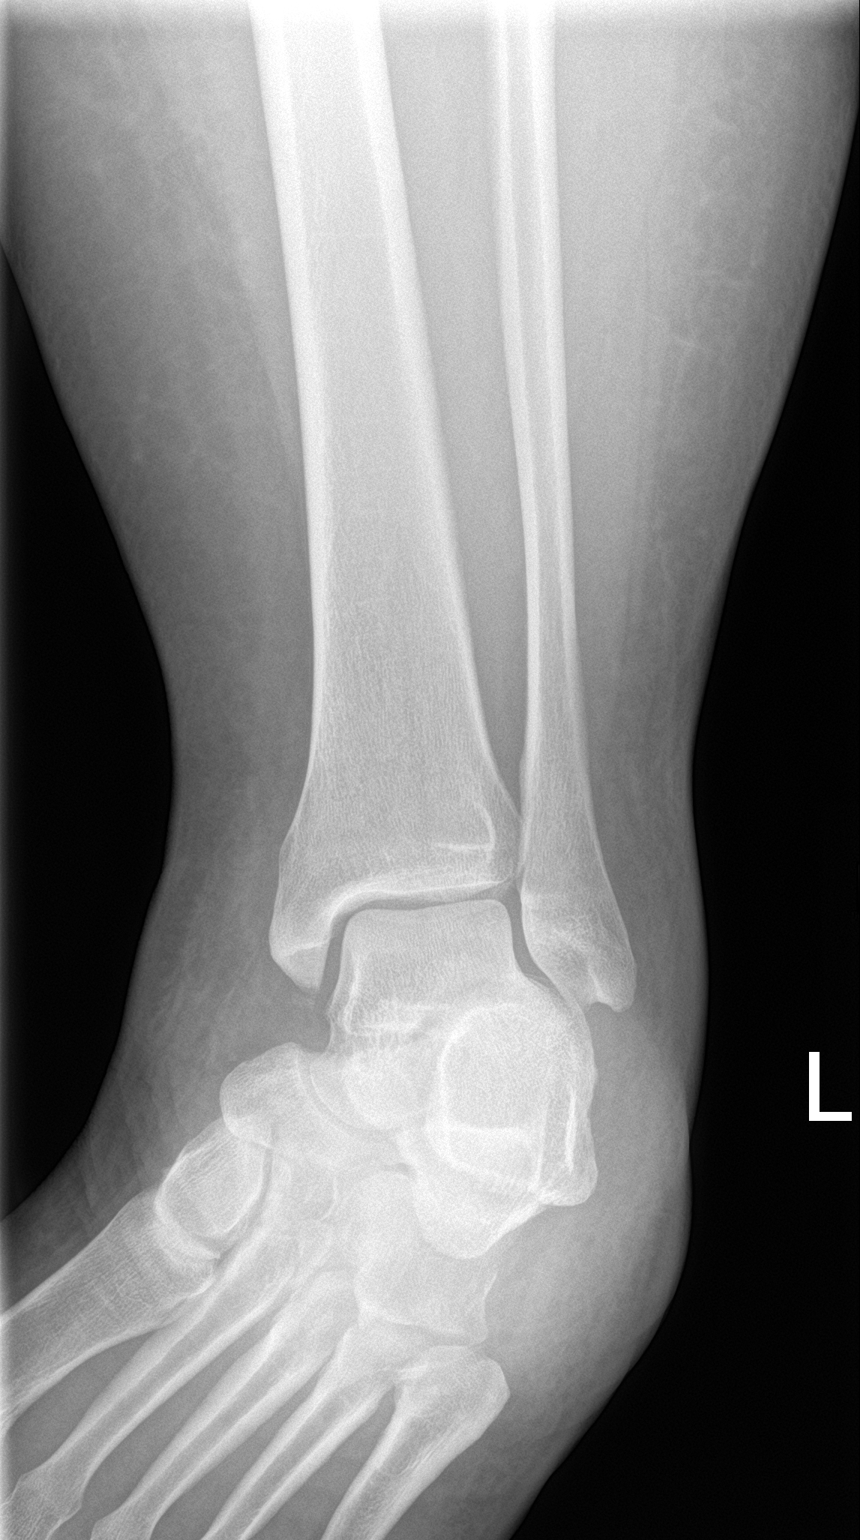

[ankle lat]
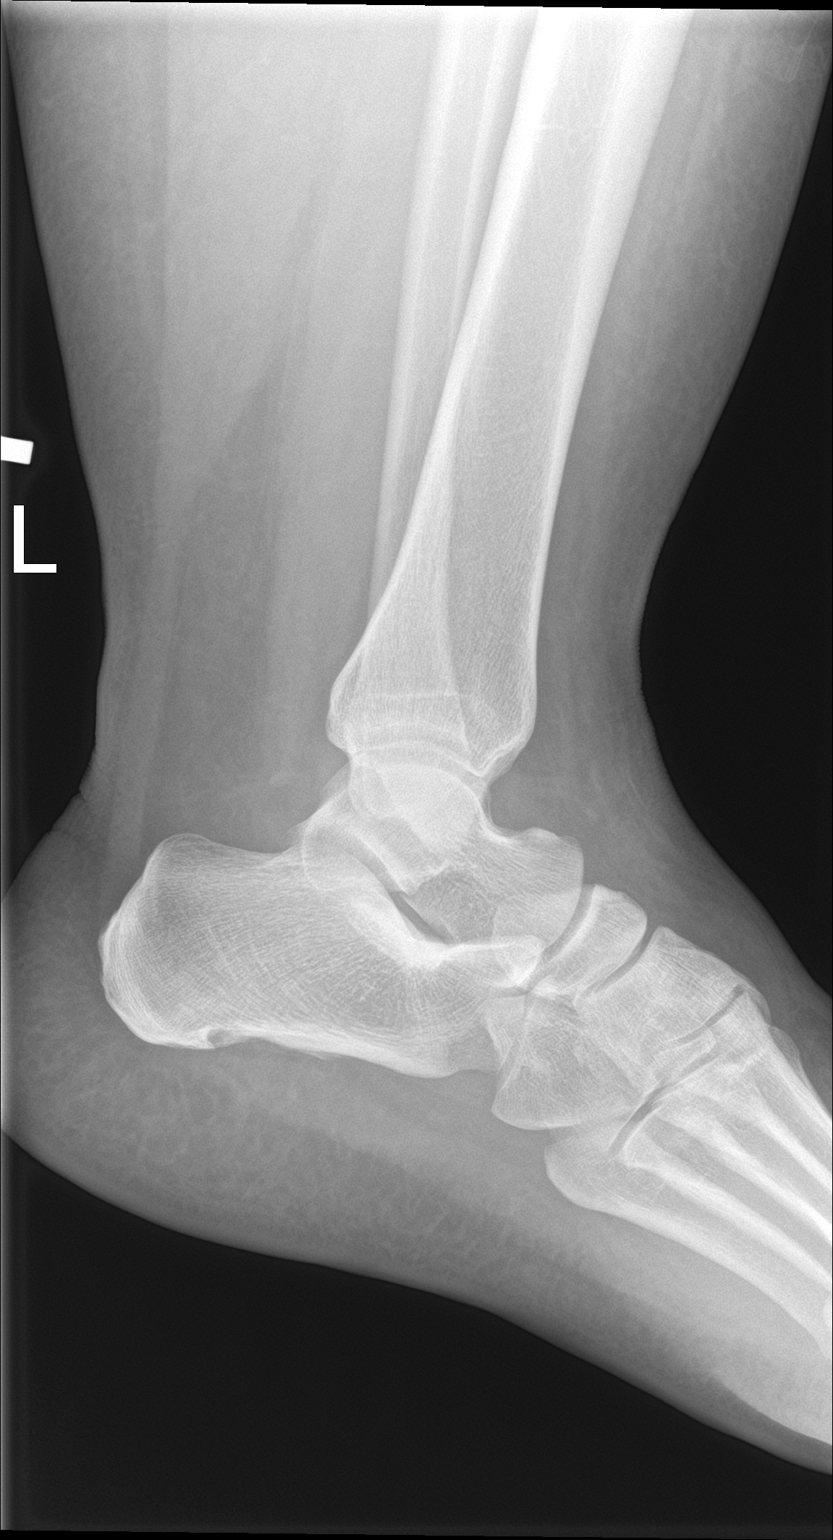

[3 of 3 positions shown; findings below may reference images not displayed]

FINDINGS: Frontal, oblique, lateral views of the left ankle demonstrate no
fractures. Alignment is anatomic. Joint spaces are well preserved.
Diffuse soft tissue edema.
IMPRESSION: 1. Diffuse soft tissue edema.  No acute bony abnormality.

## 2020-11-06 ENCOUNTER — Emergency Department (HOSPITAL_BASED_OUTPATIENT_CLINIC_OR_DEPARTMENT_OTHER)
Admission: EM | Admit: 2020-11-06 | Discharge: 2020-11-06 | Disposition: A | Discharge status: Home / Self Care | Payer: PRIVATE HEALTH INSURANCE | Attending: Emergency Medicine | Admitting: Emergency Medicine

## 2020-11-06 ENCOUNTER — Emergency Department (HOSPITAL_BASED_OUTPATIENT_CLINIC_OR_DEPARTMENT_OTHER): Payer: PRIVATE HEALTH INSURANCE

## 2020-11-06 ENCOUNTER — Encounter (HOSPITAL_BASED_OUTPATIENT_CLINIC_OR_DEPARTMENT_OTHER): Payer: Self-pay

## 2020-11-06 ENCOUNTER — Other Ambulatory Visit: Payer: Self-pay

## 2020-11-06 DIAGNOSIS — M25562 Pain in left knee: Secondary | ICD-10-CM | POA: Diagnosis present

## 2020-11-06 MED ORDER — IBUPROFEN 800 MG PO TABS
800.0000 mg | ORAL_TABLET | Freq: Once | ORAL | Status: AC
  Administered 2020-11-06: 800 mg via ORAL
  Filled 2020-11-06: qty 1

## 2020-11-06 NOTE — ED Provider Notes (Addendum)
[facility] EMERGENCY DEPARTMENT Provider Note   CSN: [id number] Arrival date & time: 11/06/20  1626     History Chief Complaint  Patient presents with  . Knee Pain    Cynthia Petty is a 35 y.o. female.  HPI Patient is a 35 year old female presented today with left knee pain.  She states is been ongoing for several days approximately 3-4.  He states that she also has some instability in her knee and feels that it is nearly hyperextending when she is walking.  She denies any trauma or twisting injuries.  She states she has some pain to the anterior aspect of her left knee.  She states it is somewhat worse with walking.  She is very active of her jobs.  She is also morbidly obese.  No other associated symptoms.  Denies any weakness or numbness in the leg.    Past Medical History:  Diagnosis Date  . Morbid obesity (HCC)     There are no problems to display for this patient.   Past Surgical History:  Procedure Laterality Date  . CESAREAN SECTION       OB History   No obstetric history on file.     History reviewed. No pertinent family history.  Social History   Tobacco Use  . Smoking status: Never Smoker  . Smokeless tobacco: Never Used  Vaping Use  . Vaping Use: Never used  Substance Use Topics  . Alcohol use: No  . Drug use: No    Home Medications Prior to Admission medications   Medication Sig Start Date End Date Taking? Authorizing Provider  azithromycin (ZITHROMAX) 250 MG tablet Take 1 tablet (250 mg total) by mouth daily. Take first 2 tablets together, then 1 every day until finished. 04/07/18   Terrilee Files, MD  diphenhydrAMINE (BENADRYL) 25 mg capsule Take 1 capsule (25 mg total) by mouth every 8 (eight) hours as needed for itching. 03/25/15   Horton, Mayer Masker, MD  famotidine (PEPCID) 20 MG tablet Take 1 tablet (20 mg total) by mouth 2 (two) times daily. 10/14/16   Palumbo, April, MD  levonorgestrel (MIRENA) 20 MCG/24HR IUD 1 each by  Intrauterine route once. Inserted in 2010    [provider]  loratadine (CLARITIN) 10 MG tablet Take 1 tablet (10 mg total) by mouth daily. 10/14/16   Palumbo, April, MD  potassium chloride 20 MEQ TBCR Take 20 mEq by mouth daily. 02/23/15   Tilden Fossa, MD  predniSONE (DELTASONE) 20 MG tablet 3 tabs po day one, then 2 po daily x 4 days 10/14/16   Nicanor Alcon, April, MD    Allergies    Patient has no known allergies.  Review of Systems   Review of Systems  Constitutional: Negative for fever.  HENT: Negative for congestion.   Respiratory: Negative for shortness of breath.   Cardiovascular: Negative for chest pain.  Gastrointestinal: Negative for abdominal distention.  Musculoskeletal:       Left knee pain  Neurological: Negative for dizziness and headaches.    Physical Exam Updated Vital Signs BP 108/66 (BP Location: Left Arm)   Pulse 73   Temp 98 F (36.7 C) (Oral)   Resp 18   Ht 5\' 3"  (1.6 m)   Wt (!) 138.8 kg   LMP 10/11/2020   SpO2 98%   BMI 54.21 kg/m   Physical Exam Vitals and nursing note reviewed.  Constitutional:      General: She is not in acute distress.  Appearance: Normal appearance. She is not ill-appearing.  HENT:     Head: Normocephalic and atraumatic.  Eyes:     General: No scleral icterus.       Right eye: No discharge.        Left eye: No discharge.     Conjunctiva/sclera: Conjunctivae normal.  Cardiovascular:     Comments: Distal PT DP pulses 3+ and symmetric Pulmonary:     Effort: Pulmonary effort is normal.     Breath sounds: No stridor.  Musculoskeletal:     Comments: Left knee pain with myalgias palpation of the patella no other significant tenderness to palpation.  Full range of motion and good strength.  There is some crepitus of the knee however.  There is no redness or swelling of the skin or rash present.  McMurray's sign is +  Skin:    Comments: No rashes or bruising  Neurological:     Mental Status: She is alert and  oriented to person, place, and time. Mental status is at baseline.     ED Results / Procedures / Treatments   Labs (all labs ordered are listed, but only abnormal results are displayed) Labs Reviewed - No data to display  EKG None  Radiology DG Knee Complete 4 Views Left  Result Date: 11/06/2020 CLINICAL DATA:  Left knee pain for days. EXAM: LEFT KNEE - COMPLETE 4+ VIEW COMPARISON:  None. FINDINGS: Normal alignment and joint spaces. Mild tricompartmental peripheral spurring. Small knee joint effusion suspected. No fracture, erosion, or bony destruction. 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 Electronically Signed   By: Narda Rutherford M.D.   On: 11/06/2020 18:19    Procedures Procedures   Medications Ordered in ED Medications  ibuprofen (ADVIL) tablet 800 mg (800 mg Oral Given 11/06/20 1730)    ED Course  I have reviewed the triage vital signs and the nursing notes.  Pertinent labs & imaging results that were available during my care of the patient were reviewed by me and considered in my medical decision making (see chart for details).    MDM Rules/Calculators/A&P                          Patient 35 year old female presented today with left knee pain is been ongoing for several days.  She has had some joint instability.  She has not had any injury or twisting injury to her knee.  No trauma.  Given her physical exam and history I have high concern for meniscal injury she has no evidence of ACL, PCL injury given the lack of joint instability on my examination with negative anterior posterior drawer.  Offered leg immobilization with crutches which she declined.  Will provide patient with hinged knee brace and discharged to follow-up with orthopedics for follow-up and additional evaluation.  Xray - no fracture -- advanced degeneration.   Ortho FU.  Final Clinical Impression(s) / ED Diagnoses Final diagnoses:  Acute pain of left knee     Rx / DC Orders ED Discharge Orders    None       Gailen Shelter, [place] 11/06/20 1938    Gailen Shelter, [place] 11/06/20 1939    Virgina Norfolk, DO 11/06/20 2311

## 2020-11-06 NOTE — ED Triage Notes (Signed)
Pt c/o left knee pain for the past few days. States feels like it was "buckling" when walking through halls at work. 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

## 2020-11-06 NOTE — Discharge Instructions (Signed)
The x-ray of your knee is without any fracture.  As we discussed I have a high suspicion of this could be a tendon injury however it may improve over time, rest, ice, elevation and compression.  Please wear the knee sleeve/brace that you were given today.  [facility]-up with [facility].  Please use Tylenol or ibuprofen for pain.  You may use 600 mg ibuprofen every 6 hours or 1000 mg of Tylenol every 6 hours.  You may choose to alternate between the 2.  This would be most effective.  Not to exceed 4 g of Tylenol within 24 hours.  Not to exceed 3200 mg ibuprofen 24 hours.

## 2022-02-16 ENCOUNTER — Emergency Department (HOSPITAL_BASED_OUTPATIENT_CLINIC_OR_DEPARTMENT_OTHER)
Admission: EM | Admit: 2022-02-16 | Discharge: 2022-02-16 | Disposition: A | Discharge status: Home / Self Care | Payer: No Typology Code available for payment source | Attending: Emergency Medicine | Admitting: Emergency Medicine

## 2022-02-16 ENCOUNTER — Other Ambulatory Visit: Payer: Self-pay

## 2022-02-16 ENCOUNTER — Encounter (HOSPITAL_BASED_OUTPATIENT_CLINIC_OR_DEPARTMENT_OTHER): Payer: Self-pay | Admitting: Emergency Medicine

## 2022-02-16 DIAGNOSIS — H66002 Acute suppurative otitis media without spontaneous rupture of ear drum, left ear: Secondary | ICD-10-CM | POA: Diagnosis not present

## 2022-02-16 DIAGNOSIS — R0981 Nasal congestion: Secondary | ICD-10-CM | POA: Insufficient documentation

## 2022-02-16 DIAGNOSIS — H9202 Otalgia, left ear: Secondary | ICD-10-CM | POA: Diagnosis present

## 2022-02-16 HISTORY — DX: Prediabetes: R73.03

## 2022-02-16 MED ORDER — AMOXICILLIN 500 MG PO CAPS: 500.0000 mg | ORAL_CAPSULE | Freq: Three times a day (TID) | ORAL | 0 refills | Status: DC

## 2023-07-09 ENCOUNTER — Emergency Department (HOSPITAL_BASED_OUTPATIENT_CLINIC_OR_DEPARTMENT_OTHER): Payer: No Typology Code available for payment source

## 2023-07-09 ENCOUNTER — Encounter (HOSPITAL_BASED_OUTPATIENT_CLINIC_OR_DEPARTMENT_OTHER): Payer: Self-pay

## 2023-07-09 ENCOUNTER — Emergency Department (HOSPITAL_BASED_OUTPATIENT_CLINIC_OR_DEPARTMENT_OTHER)
Admission: EM | Admit: 2023-07-09 | Discharge: 2023-07-09 | Disposition: A | Discharge status: Home / Self Care | Payer: No Typology Code available for payment source | Attending: Emergency Medicine | Admitting: Emergency Medicine

## 2023-07-09 ENCOUNTER — Other Ambulatory Visit: Payer: Self-pay

## 2023-07-09 DIAGNOSIS — S0990XA Unspecified injury of head, initial encounter: Secondary | ICD-10-CM | POA: Diagnosis not present

## 2023-07-09 DIAGNOSIS — Y92410 Unspecified street and highway as the place of occurrence of the external cause: Secondary | ICD-10-CM | POA: Insufficient documentation

## 2023-07-09 DIAGNOSIS — M542 Cervicalgia: Secondary | ICD-10-CM

## 2023-07-09 DIAGNOSIS — S20412A Abrasion of left back wall of thorax, initial encounter: Secondary | ICD-10-CM | POA: Insufficient documentation

## 2023-07-09 DIAGNOSIS — M549 Dorsalgia, unspecified: Secondary | ICD-10-CM

## 2023-07-09 DIAGNOSIS — S1081XA Abrasion of other specified part of neck, initial encounter: Secondary | ICD-10-CM | POA: Insufficient documentation

## 2023-07-09 DIAGNOSIS — S199XXA Unspecified injury of neck, initial encounter: Secondary | ICD-10-CM | POA: Diagnosis present

## 2023-07-09 MED ORDER — METHOCARBAMOL 500 MG PO TABS
500.0000 mg | ORAL_TABLET | Freq: Once | ORAL | Status: AC
  Administered 2023-07-09: 500 mg via ORAL
  Filled 2023-07-09: qty 1

## 2023-07-09 MED ORDER — ACETAMINOPHEN 500 MG PO TABS
1000.0000 mg | ORAL_TABLET | Freq: Once | ORAL | Status: AC
  Administered 2023-07-09: 1000 mg via ORAL
  Filled 2023-07-09: qty 2

## 2023-07-09 MED ORDER — IBUPROFEN 800 MG PO TABS
800.0000 mg | ORAL_TABLET | Freq: Once | ORAL | Status: AC
  Administered 2023-07-09: 800 mg via ORAL
  Filled 2023-07-09: qty 1

## 2023-07-09 MED ORDER — METHOCARBAMOL 500 MG PO TABS: 500.0000 mg | ORAL_TABLET | Freq: Two times a day (BID) | ORAL | 0 refills | Status: DC
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
## 2023-09-27 ENCOUNTER — Emergency Department (HOSPITAL_BASED_OUTPATIENT_CLINIC_OR_DEPARTMENT_OTHER)
Admission: EM | Admit: 2023-09-27 | Discharge: 2023-09-27 | Disposition: A | Discharge status: Home / Self Care | Payer: Medicaid Other | Attending: Emergency Medicine | Admitting: Emergency Medicine

## 2023-09-27 ENCOUNTER — Emergency Department (HOSPITAL_BASED_OUTPATIENT_CLINIC_OR_DEPARTMENT_OTHER): Payer: Medicaid Other

## 2023-09-27 ENCOUNTER — Other Ambulatory Visit: Payer: Self-pay

## 2023-09-27 ENCOUNTER — Encounter (HOSPITAL_BASED_OUTPATIENT_CLINIC_OR_DEPARTMENT_OTHER): Payer: Self-pay | Admitting: Emergency Medicine

## 2023-09-27 DIAGNOSIS — J101 Influenza due to other identified influenza virus with other respiratory manifestations: Secondary | ICD-10-CM | POA: Insufficient documentation

## 2023-09-27 DIAGNOSIS — R059 Cough, unspecified: Secondary | ICD-10-CM | POA: Diagnosis present

## 2023-09-27 DIAGNOSIS — Z20822 Contact with and (suspected) exposure to covid-19: Secondary | ICD-10-CM | POA: Diagnosis not present

## 2023-09-27 LAB — BASIC METABOLIC PANEL
Anion gap: 7 (ref 5–15)
BUN: 9 mg/dL (ref 6–20)
CO2: 22 mmol/L (ref 22–32)
Calcium: 9.7 mg/dL (ref 8.9–10.3)
Chloride: 105 mmol/L (ref 98–111)
Creatinine, Ser: 0.8 mg/dL (ref 0.44–1.00)
GFR, Estimated: >60 mL/min (ref >60)
Glucose, Bld: 112 mg/dL — ABNORMAL HIGH (ref 70–99)
Potassium: 4.3 mmol/L (ref 3.5–5.1)
Sodium: 134 mmol/L — ABNORMAL LOW (ref 135–145)

## 2023-09-27 LAB — CBC WITH DIFFERENTIAL/PLATELET
Abs Immature Granulocytes: 0.02 10*3/uL (ref 0.00–0.07)
Basophils Absolute: 0 10*3/uL (ref 0.0–0.1)
Basophils Relative: 0 %
Eosinophils Absolute: 0 10*3/uL (ref 0.0–0.5)
Eosinophils Relative: 0 %
HCT: 38.4 % (ref 36.0–46.0)
Hemoglobin: 12.7 g/dL (ref 12.0–15.0)
Immature Granulocytes: 0 %
Lymphocytes Relative: 8 %
Lymphs Abs: 0.4 10*3/uL — ABNORMAL LOW (ref 0.7–4.0)
MCH: 30.7 pg (ref 26.0–34.0)
MCHC: 33.1 g/dL (ref 30.0–36.0)
MCV: 92.8 fL (ref 80.0–100.0)
Monocytes Absolute: 0.4 10*3/uL (ref 0.1–1.0)
Monocytes Relative: 8 %
Neutro Abs: 4.4 10*3/uL (ref 1.7–7.7)
Neutrophils Relative %: 84 %
Platelets: 205 10*3/uL (ref 150–400)
RBC: 4.14 MIL/uL (ref 3.87–5.11)
RDW: 12.4 % (ref 11.5–15.5)
WBC: 5.2 10*3/uL (ref 4.0–10.5)
nRBC: 0 % (ref 0.0–0.2)

## 2023-09-27 LAB — RESP PANEL BY RT-PCR (RSV, FLU A&B, COVID)  RVPGX2
Influenza A by PCR: POSITIVE — AB
Influenza B by PCR: NEGATIVE
Resp Syncytial Virus by PCR: NEGATIVE
SARS Coronavirus 2 by RT PCR: NEGATIVE

## 2023-09-27 LAB — HCG, SERUM, QUALITATIVE: Preg, Serum: NEGATIVE

## 2023-09-27 MED ORDER — ONDANSETRON 4 MG PO TBDP: 4.0000 mg | ORAL_TABLET | Freq: Once | ORAL | Status: DC

## 2023-09-27 MED ORDER — PROMETHAZINE-DM 6.25-15 MG/5ML PO SYRP: 5.0000 mL | ORAL_SOLUTION | Freq: Four times a day (QID) | ORAL | 0 refills | Status: DC | PRN

## 2023-09-27 MED ORDER — ONDANSETRON 4 MG PO TBDP
4.0000 mg | ORAL_TABLET | Freq: Once | ORAL | Status: AC
  Administered 2023-09-27: 4 mg via ORAL
  Filled 2023-09-27: qty 1

## 2023-09-27 MED ORDER — SODIUM CHLORIDE 0.9 % IV BOLUS
1000.0000 mL | Freq: Once | INTRAVENOUS | Status: AC
  Administered 2023-09-27: 1000 mL via INTRAVENOUS

## 2023-09-27 MED ORDER — NAPROXEN 250 MG PO TABS: 500.0000 mg | ORAL_TABLET | Freq: Once | ORAL | Status: DC

## 2023-09-27 MED ORDER — ONDANSETRON HCL 4 MG PO TABS: 4.0000 mg | ORAL_TABLET | Freq: Four times a day (QID) | ORAL | 0 refills | Status: DC

## 2023-09-27 MED ORDER — KETOROLAC TROMETHAMINE 15 MG/ML IJ SOLN
15.0000 mg | Freq: Once | INTRAMUSCULAR | Status: AC
  Administered 2023-09-27: 15 mg via INTRAVENOUS
  Filled 2023-09-27: qty 1

## 2023-09-27 MED ORDER — NAPROXEN 500 MG PO TABS: 500.0000 mg | ORAL_TABLET | Freq: Two times a day (BID) | ORAL | 0 refills | Status: DC

## 2023-09-27 MED ORDER — ACETAMINOPHEN 500 MG PO TABS
1000.0000 mg | ORAL_TABLET | Freq: Once | ORAL | Status: AC
  Administered 2023-09-27: 1000 mg via ORAL
  Filled 2023-09-27: qty 2

## 2023-09-27 MED ORDER — ONDANSETRON HCL 4 MG/2ML IJ SOLN
4.0000 mg | Freq: Once | INTRAMUSCULAR | Status: AC
  Administered 2023-09-27: 4 mg via INTRAVENOUS
  Filled 2023-09-27: qty 2

## 2023-09-27 NOTE — ED Provider Notes (Signed)
[place] EMERGENCY DEPARTMENT AT [facility] Provider Note   CSN: [id number] Arrival date & time: 09/27/23  1242     History  Chief Complaint  Patient presents with   Chest Pain    Cynthia Petty is a 38 y.o. female.  Patient is a 38 year old female who presents emergency department with a chief complaint of generalized malaise and fatigue, fevers, chest pain, cough, congestion, generalized body cramping, nausea and vomiting which has been ongoing since yesterday.  Patient notes that she did have a positive home influenza test yesterday and did start on Tamiflu.  Patient notes that she has had no associated diarrhea.  She denies any melena, hematochezia, hematemesis.  She denies any associated dizziness, lightheadedness or syncope.  She does note that she was exposed to her child who was sick with influenza.  She denies any known history of cardiac or pulmonary disease.   Chest Pain Associated symptoms: cough        Home Medications Prior to Admission medications   Medication Sig Start Date End Date Taking? Authorizing Provider  amoxicillin (AMOXIL) 500 MG capsule Take 1 capsule (500 mg total) by mouth 3 (three) times daily. 02/16/22   Linwood Dibbles, MD  azithromycin (ZITHROMAX) 250 MG tablet Take 1 tablet (250 mg total) by mouth daily. Take first 2 tablets together, then 1 every day until finished. 04/07/18   Terrilee Files, MD  diphenhydrAMINE (BENADRYL) 25 mg capsule Take 1 capsule (25 mg total) by mouth every 8 (eight) hours as needed for itching. 03/25/15   Horton, Mayer Masker, MD  famotidine (PEPCID) 20 MG tablet Take 1 tablet (20 mg total) by mouth 2 (two) times daily. 10/14/16   Palumbo, April, MD  levonorgestrel (MIRENA) 20 MCG/24HR IUD 1 each by Intrauterine route once. Inserted in 2010    [provider]  loratadine (CLARITIN) 10 MG tablet Take 1 tablet (10 mg total) by mouth daily. 10/14/16   Palumbo, April, MD  methocarbamol (ROBAXIN) 500 MG tablet Take  1 tablet (500 mg total) by mouth 2 (two) times daily. 07/09/23   Roemhildt, Lorin T, PA-C  potassium chloride 20 MEQ TBCR Take 20 mEq by mouth daily. 02/23/15   Tilden Fossa, MD  predniSONE (DELTASONE) 20 MG tablet 3 tabs po day one, then 2 po daily x 4 days 10/14/16   Nicanor Alcon, April, MD      Allergies    Patient has no known allergies.    Review of Systems   Review of Systems  Constitutional:  Positive for chills.  Respiratory:  Positive for cough.   Cardiovascular:  Positive for chest pain.  Musculoskeletal:  Positive for myalgias.  All other systems reviewed and are negative.   Physical Exam Updated Vital Signs BP 120/70 (BP Location: Right Arm)   Pulse 99   Temp (!) 101.8 F (38.8 C)   Resp 20   Ht 5\' 2"  (1.575 m)   Wt 117.9 kg   SpO2 97%   BMI 47.55 kg/m  Physical Exam Constitutional:      Appearance: Normal appearance.  HENT:     Head: Normocephalic and atraumatic.     Nose: Nose normal.     Mouth/Throat:     Mouth: Mucous membranes are moist.  Eyes:     Extraocular Movements: Extraocular movements intact.     Conjunctiva/sclera: Conjunctivae normal.     Pupils: Pupils are equal, round, and reactive to light.  Cardiovascular:     Rate and Rhythm: Normal rate and  regular rhythm.     Pulses: Normal pulses.     Heart sounds: Normal heart sounds.  Pulmonary:     Effort: Pulmonary effort is normal.     Breath sounds: Normal breath sounds.  Abdominal:     General: Abdomen is flat. Bowel sounds are normal.     Palpations: Abdomen is soft.  Musculoskeletal:        General: Normal range of motion.     Cervical back: Normal range of motion and neck supple.  Skin:    General: Skin is warm and dry.  Neurological:     General: No focal deficit present.     Mental Status: She is alert and oriented to person, place, and time. Mental status is at baseline.  Psychiatric:        Mood and Affect: Mood normal.        Behavior: Behavior normal.        Thought Content:  Thought content normal.        Judgment: Judgment normal.     ED Results / Procedures / Treatments   Labs (all labs ordered are listed, but only abnormal results are displayed) Labs Reviewed  RESP PANEL BY RT-PCR (RSV, FLU A&B, COVID)  RVPGX2 - Abnormal; Notable for the following components:      Result Value   Influenza A by PCR POSITIVE (*)    All other components within normal limits  BASIC METABOLIC PANEL  HCG, SERUM, QUALITATIVE  CBC WITH DIFFERENTIAL/PLATELET    EKG None  Radiology DG Chest 2 View Result Date: 09/27/2023 CLINICAL DATA:  Cough, chest pain, vomiting. EXAM: CHEST - 2 VIEW COMPARISON:  04/07/2018. FINDINGS: Trachea is midline. Heart size normal. Lungs are clear. No pleural fluid. IMPRESSION: No acute findings. Electronically Signed   By: Leanna Battles M.D.   On: 09/27/2023 13:19    Procedures Procedures    Medications Ordered in ED Medications  acetaminophen (TYLENOL) tablet 1,000 mg (1,000 mg Oral Given 09/27/23 1303)  ondansetron (ZOFRAN-ODT) disintegrating tablet 4 mg (4 mg Oral Given 09/27/23 1303)  sodium chloride 0.9 % bolus 1,000 mL (1,000 mLs Intravenous New Bag/Given 09/27/23 1535)  ketorolac (TORADOL) 15 MG/ML injection 15 mg (15 mg Intravenous Given 09/27/23 1533)  ondansetron (ZOFRAN) injection 4 mg (4 mg Intravenous Given 09/27/23 1533)    ED Course/ Medical Decision Making/ A&P                                 Medical Decision Making Patient is doing well at this time and is stable for discharge home.  Patient is feeling better after IV fluids and medications.  She is positive for influenza A and is already on Tamiflu at this time.  Vital signs are otherwise stable with no indication for sepsis no associated hypoxia.  Chest x-ray demonstrated no indication for pneumonia.  Blood work has been unremarkable with no changes in kidney function or electrolytes.  Will continue symptomatic treatment on an outpatient basis and recommend close follow-up with  primary care doctor.  Strict return precautions were provided for any new or worsening symptoms.  Patient voiced understanding and had no additional questions.  Imaging and lab work was independently reviewed by myself.  Amount and/or Complexity of Data Reviewed Labs: ordered. Radiology: ordered.  Risk OTC drugs. Prescription drug management.           Final Clinical Impression(s) / ED Diagnoses Final diagnoses:  None    Rx / DC Orders ED Discharge Orders     None         Kathlen Mody 09/27/23 1621    Vanetta Mulders, MD 09/27/23 [phone]

## 2023-09-27 NOTE — ED Triage Notes (Signed)
Pt c/o mid CP since last night; also vomiting since yesterday; positive for flu at work and had Covid 1 wk ago; had Tylenol around 0900; started Tamiflu yesterday; c/o all over body cramping

## 2024-08-22 ENCOUNTER — Other Ambulatory Visit: Payer: Self-pay

## 2024-08-22 ENCOUNTER — Encounter (HOSPITAL_BASED_OUTPATIENT_CLINIC_OR_DEPARTMENT_OTHER): Payer: Self-pay

## 2024-08-22 ENCOUNTER — Emergency Department (HOSPITAL_BASED_OUTPATIENT_CLINIC_OR_DEPARTMENT_OTHER)
Admission: EM | Admit: 2024-08-22 | Discharge: 2024-08-22 | Disposition: A | Discharge status: Home / Self Care | Attending: Emergency Medicine | Admitting: Emergency Medicine

## 2024-08-22 DIAGNOSIS — R059 Cough, unspecified: Secondary | ICD-10-CM | POA: Diagnosis present

## 2024-08-22 DIAGNOSIS — J069 Acute upper respiratory infection, unspecified: Secondary | ICD-10-CM | POA: Diagnosis not present

## 2024-08-22 MED ORDER — BENZONATATE 100 MG PO CAPS: 100.0000 mg | ORAL_CAPSULE | Freq: Three times a day (TID) | ORAL | 0 refills | Status: AC

## 2024-08-22 MED ORDER — GUAIFENESIN-CODEINE 100-10 MG/5ML PO SOLN: 5.0000 mL | Freq: Three times a day (TID) | ORAL | 0 refills | Status: AC | PRN

## 2024-08-22 MED ORDER — NAPROXEN 500 MG PO TABS: 500.0000 mg | ORAL_TABLET | Freq: Two times a day (BID) | ORAL | 0 refills | Status: AC

## 2024-08-22 NOTE — ED Triage Notes (Signed)
 Cough, congestion, fatigue, body aches, chills for 2 days

## 2024-08-22 NOTE — ED Provider Notes (Signed)
 " [place] EMERGENCY DEPARTMENT AT [facility] Provider Note   CSN: [id number] Arrival date & time: 08/22/24  1647     Patient presents with: Cough   Cynthia Petty is a 38 y.o. female here for evaluation of cough.  Patient states in the last few days she has had chills, myalgias, congestion, rhinorrhea, cough.  No vomiting.  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  No chest pain, shortness of breath, abdominal pain, emesis.  Hurts all over.  OTC meds at home   HPI     Prior to Admission medications  Medication Sig Start Date End Date Taking? Authorizing Provider  benzonatate  (TESSALON ) 100 MG capsule Take 1 capsule (100 mg total) by mouth every 8 (eight) hours. 08/22/24  Yes Captola Teschner A, PA-C  guaiFENesin -codeine  100-10 MG/5ML syrup Take 5 mLs by mouth 3 (three) times daily as needed for cough. 08/22/24  Yes Montario Zilka A, PA-C  naproxen  (NAPROSYN ) 500 MG tablet Take 1 tablet (500 mg total) by mouth 2 (two) times daily. 08/22/24  Yes Jacquese Hackman A, PA-C  diphenhydrAMINE  (BENADRYL ) 25 mg capsule Take 1 capsule (25 mg total) by mouth every 8 (eight) hours as needed for itching. 03/25/15   Horton, Charmaine FALCON, MD  famotidine  (PEPCID ) 20 MG tablet Take 1 tablet (20 mg total) by mouth 2 (two) times daily. 10/14/16   Palumbo, April, MD  levonorgestrel (MIRENA) 20 MCG/24HR IUD 1 each by Intrauterine route once. Inserted in 2010    [provider]  loratadine  (CLARITIN ) 10 MG tablet Take 1 tablet (10 mg total) by mouth daily. 10/14/16   Palumbo, April, MD  potassium chloride  20 MEQ TBCR Take 20 mEq by mouth daily. 02/23/15   Griselda Norris, MD    Allergies: Patient has no known allergies.    Review of Systems  Constitutional:  Positive for activity change, appetite change, chills and fatigue.  HENT:  Positive for congestion, postnasal drip and rhinorrhea. Negative for facial swelling, sore throat, trouble swallowing and voice change.    Respiratory:  Positive for cough. Negative for shortness of breath, wheezing and stridor.   Cardiovascular: Negative.   Gastrointestinal: Negative.   Genitourinary: Negative.   Musculoskeletal: Negative.   Skin: Negative.   Neurological: Negative.   All other systems reviewed and are negative.   Updated Vital Signs BP 120/80   Pulse 86   Temp 98.7 F (37.1 C)   Resp 18   LMP 08/06/2024   SpO2 100%   Physical Exam Vitals and nursing note reviewed.  Constitutional:      General: She is not in acute distress.    Appearance: She is well-developed. She is not ill-appearing, toxic-appearing or diaphoretic.  HENT:     Head: Normocephalic and atraumatic.     Nose: Congestion and rhinorrhea present.     Mouth/Throat:     Mouth: Mucous membranes are moist.  Eyes:     Pupils: Pupils are equal, round, and reactive to light.  Cardiovascular:     Rate and Rhythm: Normal rate.     Pulses: Normal pulses.     Heart sounds: Normal heart sounds.  Pulmonary:     Effort: Pulmonary effort is normal. No respiratory distress.     Breath sounds: Normal breath sounds.  Abdominal:     General: Bowel sounds are normal. There is no distension.     Palpations: Abdomen is soft.     Tenderness: There is no abdominal tenderness.  Musculoskeletal:  General: Normal range of motion.     Cervical back: Normal range of motion.  Skin:    General: Skin is warm and dry.     Capillary Refill: Capillary refill takes less than 2 seconds.  Neurological:     General: No focal deficit present.     Mental Status: She is alert.     Gait: Gait normal.  Psychiatric:        Mood and Affect: Mood normal.    (all labs ordered are listed, but only abnormal results are displayed) Labs Reviewed - No data to display  EKG: None  Radiology: No results found.   Procedures   Medications Ordered in the ED - No data to display  Here for evaluation of cough.  Over last 2 days has had cough, congestion,  rhinorrhea, myalgias.  Here her heart and lungs are clear.  Does not appear grossly fluid overloaded.  Speaks in full sentences without difficulty.  Ambulatory without difficulty.  PERC negative.  Suspect she likely has a viral illness.  Will treat as such with symptomatic management.  Appears clinically well-hydrated  Low suspicion for bacterial pneumonia, pneumothorax, PE, dissection, sepsis, PTA, RPA, meningitis, rhabdomyolysis, VTE, ischemia.  The patient has been appropriately medically screened and/or stabilized in the ED. I have low suspicion for any other emergent medical condition which would require further screening, evaluation or treatment in the ED or require inpatient management.  Patient is hemodynamically stable and in no acute distress.  Patient able to ambulate in department prior to ED.  Evaluation does not show acute pathology that would require ongoing or additional emergent interventions while in the emergency department or further inpatient treatment.  I have discussed the diagnosis with the patient and answered all questions.  Pain is been managed while in the emergency department and patient has no further complaints prior to discharge.  Patient is comfortable with plan discussed in room and is stable for discharge at this time.  I have discussed strict return precautions for returning to the emergency department.  Patient was encouraged to follow-up with PCP/specialist refer to at discharge.                                    Medical Decision Making Amount and/or Complexity of Data Reviewed External Data Reviewed: labs, radiology and notes.  Risk OTC drugs. Prescription drug management. Decision regarding hospitalization. Diagnosis or treatment significantly limited by social determinants of health.        Final diagnoses:  Viral URI with cough    ED Discharge Orders          Ordered    guaiFENesin -codeine  100-10 MG/5ML syrup  3 times daily PRN         08/22/24 1946    naproxen  (NAPROSYN ) 500 MG tablet  2 times daily        08/22/24 1946    benzonatate  (TESSALON ) 100 MG capsule  Every 8 hours        08/22/24 1946               Christan Defranco A, PA-C 08/22/24 1955  "

## 2024-08-22 NOTE — Discharge Instructions (Signed)
 It is a pleasure taking care of you here today.  As we discussed you likely have a viral illness.  I have written you for a few medications to help with your symptoms.  Tessalon  Perles this will help with your cough.  Primarily used during the day.  Cough syrup, use caution as this medication does have a controlled substances and it may make you sleepy.  Recommend taking this at night to help you to sleep.  May also use naproxen  for any body aches, fever.  Make sure to rest and hydrate at home  Return for new or worsening symptoms
